# Patient Record
Sex: Male | Born: 1950 | Race: White | Hispanic: No | Marital: Married | Smoking: Former smoker
Health system: Southern US, Community
[De-identification: ages and names within clinical notes are randomized; demographics above are authoritative.]

## PROBLEM LIST (undated history)

## (undated) DIAGNOSIS — G4730 Sleep apnea, unspecified: Secondary | ICD-10-CM

## (undated) DIAGNOSIS — E785 Hyperlipidemia, unspecified: Secondary | ICD-10-CM

## (undated) DIAGNOSIS — M1990 Unspecified osteoarthritis, unspecified site: Secondary | ICD-10-CM

## (undated) DIAGNOSIS — I10 Essential (primary) hypertension: Secondary | ICD-10-CM

## (undated) DIAGNOSIS — C801 Malignant (primary) neoplasm, unspecified: Secondary | ICD-10-CM

## (undated) DIAGNOSIS — I639 Cerebral infarction, unspecified: Secondary | ICD-10-CM

## (undated) DIAGNOSIS — R002 Palpitations: Secondary | ICD-10-CM

## (undated) DIAGNOSIS — Z87442 Personal history of urinary calculi: Secondary | ICD-10-CM

## (undated) HISTORY — PX: LOOP RECORDER REMOVAL: EP1215

## (undated) HISTORY — PX: CATARACT EXTRACTION, BILATERAL: SHX1313

## (undated) HISTORY — PX: LOOP RECORDER INSERTION: EP1214

## (undated) HISTORY — PX: EYE SURGERY: SHX253

## (undated) HISTORY — PX: CARDIAC CATHETERIZATION: SHX172

## (undated) HISTORY — PX: NASAL SINUS SURGERY: SHX719

## (undated) HISTORY — PX: HERNIA REPAIR: SHX51

## (undated) HISTORY — PX: UMBILICAL HERNIA REPAIR: SHX196

## (undated) HISTORY — PX: APPENDECTOMY: SHX54

---

## 2015-01-29 ENCOUNTER — Ambulatory Visit: Payer: Self-pay | Admitting: Allergy and Immunology

## 2016-08-06 DIAGNOSIS — J069 Acute upper respiratory infection, unspecified: Secondary | ICD-10-CM | POA: Diagnosis not present

## 2016-08-06 DIAGNOSIS — J029 Acute pharyngitis, unspecified: Secondary | ICD-10-CM | POA: Diagnosis not present

## 2016-08-06 DIAGNOSIS — R05 Cough: Secondary | ICD-10-CM | POA: Diagnosis not present

## 2016-11-10 DIAGNOSIS — Z23 Encounter for immunization: Secondary | ICD-10-CM | POA: Diagnosis not present

## 2016-11-10 DIAGNOSIS — Z6826 Body mass index (BMI) 26.0-26.9, adult: Secondary | ICD-10-CM | POA: Diagnosis not present

## 2016-11-10 DIAGNOSIS — M25511 Pain in right shoulder: Secondary | ICD-10-CM | POA: Diagnosis not present

## 2016-12-28 DIAGNOSIS — H43811 Vitreous degeneration, right eye: Secondary | ICD-10-CM | POA: Diagnosis not present

## 2017-01-19 DIAGNOSIS — I10 Essential (primary) hypertension: Secondary | ICD-10-CM | POA: Diagnosis not present

## 2017-01-19 DIAGNOSIS — Z6827 Body mass index (BMI) 27.0-27.9, adult: Secondary | ICD-10-CM | POA: Diagnosis not present

## 2017-01-19 DIAGNOSIS — Z79899 Other long term (current) drug therapy: Secondary | ICD-10-CM | POA: Diagnosis not present

## 2017-01-19 DIAGNOSIS — Z125 Encounter for screening for malignant neoplasm of prostate: Secondary | ICD-10-CM | POA: Diagnosis not present

## 2017-01-19 DIAGNOSIS — Z1211 Encounter for screening for malignant neoplasm of colon: Secondary | ICD-10-CM | POA: Diagnosis not present

## 2017-01-19 DIAGNOSIS — Z9181 History of falling: Secondary | ICD-10-CM | POA: Diagnosis not present

## 2017-01-19 DIAGNOSIS — Z1331 Encounter for screening for depression: Secondary | ICD-10-CM | POA: Diagnosis not present

## 2017-01-19 DIAGNOSIS — Z23 Encounter for immunization: Secondary | ICD-10-CM | POA: Diagnosis not present

## 2017-01-19 DIAGNOSIS — Z1339 Encounter for screening examination for other mental health and behavioral disorders: Secondary | ICD-10-CM | POA: Diagnosis not present

## 2017-01-19 DIAGNOSIS — Z8673 Personal history of transient ischemic attack (TIA), and cerebral infarction without residual deficits: Secondary | ICD-10-CM | POA: Diagnosis not present

## 2017-01-19 DIAGNOSIS — E785 Hyperlipidemia, unspecified: Secondary | ICD-10-CM | POA: Diagnosis not present

## 2017-01-31 DIAGNOSIS — Z1212 Encounter for screening for malignant neoplasm of rectum: Secondary | ICD-10-CM | POA: Diagnosis not present

## 2017-01-31 DIAGNOSIS — Z1211 Encounter for screening for malignant neoplasm of colon: Secondary | ICD-10-CM | POA: Diagnosis not present

## 2017-12-08 DIAGNOSIS — M7552 Bursitis of left shoulder: Secondary | ICD-10-CM | POA: Diagnosis not present

## 2017-12-12 DIAGNOSIS — Z23 Encounter for immunization: Secondary | ICD-10-CM | POA: Diagnosis not present

## 2018-01-28 DIAGNOSIS — Z9181 History of falling: Secondary | ICD-10-CM | POA: Diagnosis not present

## 2018-01-28 DIAGNOSIS — Z6826 Body mass index (BMI) 26.0-26.9, adult: Secondary | ICD-10-CM | POA: Diagnosis not present

## 2018-01-28 DIAGNOSIS — M19012 Primary osteoarthritis, left shoulder: Secondary | ICD-10-CM | POA: Diagnosis not present

## 2018-01-28 DIAGNOSIS — Z0000 Encounter for general adult medical examination without abnormal findings: Secondary | ICD-10-CM | POA: Diagnosis not present

## 2018-01-28 DIAGNOSIS — I10 Essential (primary) hypertension: Secondary | ICD-10-CM | POA: Diagnosis not present

## 2018-01-28 DIAGNOSIS — Z1331 Encounter for screening for depression: Secondary | ICD-10-CM | POA: Diagnosis not present

## 2018-01-28 DIAGNOSIS — Z005 Encounter for examination of potential donor of organ and tissue: Secondary | ICD-10-CM | POA: Diagnosis not present

## 2018-01-28 DIAGNOSIS — E785 Hyperlipidemia, unspecified: Secondary | ICD-10-CM | POA: Diagnosis not present

## 2018-01-28 DIAGNOSIS — Z23 Encounter for immunization: Secondary | ICD-10-CM | POA: Diagnosis not present

## 2018-01-28 DIAGNOSIS — Z1339 Encounter for screening examination for other mental health and behavioral disorders: Secondary | ICD-10-CM | POA: Diagnosis not present

## 2018-01-28 DIAGNOSIS — E7800 Pure hypercholesterolemia, unspecified: Secondary | ICD-10-CM | POA: Diagnosis not present

## 2018-01-28 DIAGNOSIS — Z79899 Other long term (current) drug therapy: Secondary | ICD-10-CM | POA: Diagnosis not present

## 2018-02-04 DIAGNOSIS — G4733 Obstructive sleep apnea (adult) (pediatric): Secondary | ICD-10-CM | POA: Diagnosis not present

## 2018-05-06 DIAGNOSIS — G4733 Obstructive sleep apnea (adult) (pediatric): Secondary | ICD-10-CM | POA: Diagnosis not present

## 2018-05-21 DIAGNOSIS — S92354A Nondisplaced fracture of fifth metatarsal bone, right foot, initial encounter for closed fracture: Secondary | ICD-10-CM | POA: Diagnosis not present

## 2018-05-21 DIAGNOSIS — W1830XA Fall on same level, unspecified, initial encounter: Secondary | ICD-10-CM | POA: Diagnosis not present

## 2018-05-21 DIAGNOSIS — S63501A Unspecified sprain of right wrist, initial encounter: Secondary | ICD-10-CM | POA: Diagnosis not present

## 2018-07-16 DIAGNOSIS — I10 Essential (primary) hypertension: Secondary | ICD-10-CM | POA: Diagnosis not present

## 2018-07-16 DIAGNOSIS — E785 Hyperlipidemia, unspecified: Secondary | ICD-10-CM | POA: Diagnosis not present

## 2018-09-12 DIAGNOSIS — M5441 Lumbago with sciatica, right side: Secondary | ICD-10-CM | POA: Diagnosis not present

## 2018-09-12 DIAGNOSIS — M9902 Segmental and somatic dysfunction of thoracic region: Secondary | ICD-10-CM | POA: Diagnosis not present

## 2018-09-12 DIAGNOSIS — M9904 Segmental and somatic dysfunction of sacral region: Secondary | ICD-10-CM | POA: Diagnosis not present

## 2018-09-12 DIAGNOSIS — M62830 Muscle spasm of back: Secondary | ICD-10-CM | POA: Diagnosis not present

## 2018-09-12 DIAGNOSIS — M5136 Other intervertebral disc degeneration, lumbar region: Secondary | ICD-10-CM | POA: Diagnosis not present

## 2018-09-12 DIAGNOSIS — M9903 Segmental and somatic dysfunction of lumbar region: Secondary | ICD-10-CM | POA: Diagnosis not present

## 2018-09-16 DIAGNOSIS — M62830 Muscle spasm of back: Secondary | ICD-10-CM | POA: Diagnosis not present

## 2018-09-16 DIAGNOSIS — M9904 Segmental and somatic dysfunction of sacral region: Secondary | ICD-10-CM | POA: Diagnosis not present

## 2018-09-16 DIAGNOSIS — M5441 Lumbago with sciatica, right side: Secondary | ICD-10-CM | POA: Diagnosis not present

## 2018-09-16 DIAGNOSIS — M9903 Segmental and somatic dysfunction of lumbar region: Secondary | ICD-10-CM | POA: Diagnosis not present

## 2018-09-16 DIAGNOSIS — M9902 Segmental and somatic dysfunction of thoracic region: Secondary | ICD-10-CM | POA: Diagnosis not present

## 2018-09-16 DIAGNOSIS — M5136 Other intervertebral disc degeneration, lumbar region: Secondary | ICD-10-CM | POA: Diagnosis not present

## 2018-09-19 DIAGNOSIS — M5441 Lumbago with sciatica, right side: Secondary | ICD-10-CM | POA: Diagnosis not present

## 2018-09-19 DIAGNOSIS — M9904 Segmental and somatic dysfunction of sacral region: Secondary | ICD-10-CM | POA: Diagnosis not present

## 2018-09-19 DIAGNOSIS — M9902 Segmental and somatic dysfunction of thoracic region: Secondary | ICD-10-CM | POA: Diagnosis not present

## 2018-09-19 DIAGNOSIS — M62830 Muscle spasm of back: Secondary | ICD-10-CM | POA: Diagnosis not present

## 2018-09-19 DIAGNOSIS — M9903 Segmental and somatic dysfunction of lumbar region: Secondary | ICD-10-CM | POA: Diagnosis not present

## 2018-09-19 DIAGNOSIS — M5136 Other intervertebral disc degeneration, lumbar region: Secondary | ICD-10-CM | POA: Diagnosis not present

## 2018-09-20 DIAGNOSIS — M5136 Other intervertebral disc degeneration, lumbar region: Secondary | ICD-10-CM | POA: Diagnosis not present

## 2018-09-20 DIAGNOSIS — M9903 Segmental and somatic dysfunction of lumbar region: Secondary | ICD-10-CM | POA: Diagnosis not present

## 2018-09-20 DIAGNOSIS — M62830 Muscle spasm of back: Secondary | ICD-10-CM | POA: Diagnosis not present

## 2018-09-20 DIAGNOSIS — M9902 Segmental and somatic dysfunction of thoracic region: Secondary | ICD-10-CM | POA: Diagnosis not present

## 2018-09-20 DIAGNOSIS — M9904 Segmental and somatic dysfunction of sacral region: Secondary | ICD-10-CM | POA: Diagnosis not present

## 2018-09-20 DIAGNOSIS — M5441 Lumbago with sciatica, right side: Secondary | ICD-10-CM | POA: Diagnosis not present

## 2018-09-25 DIAGNOSIS — M5441 Lumbago with sciatica, right side: Secondary | ICD-10-CM | POA: Diagnosis not present

## 2018-09-25 DIAGNOSIS — M5136 Other intervertebral disc degeneration, lumbar region: Secondary | ICD-10-CM | POA: Diagnosis not present

## 2018-09-25 DIAGNOSIS — M9904 Segmental and somatic dysfunction of sacral region: Secondary | ICD-10-CM | POA: Diagnosis not present

## 2018-09-25 DIAGNOSIS — M62830 Muscle spasm of back: Secondary | ICD-10-CM | POA: Diagnosis not present

## 2018-09-25 DIAGNOSIS — M9902 Segmental and somatic dysfunction of thoracic region: Secondary | ICD-10-CM | POA: Diagnosis not present

## 2018-09-25 DIAGNOSIS — M9903 Segmental and somatic dysfunction of lumbar region: Secondary | ICD-10-CM | POA: Diagnosis not present

## 2018-09-26 DIAGNOSIS — M9903 Segmental and somatic dysfunction of lumbar region: Secondary | ICD-10-CM | POA: Diagnosis not present

## 2018-09-26 DIAGNOSIS — M5441 Lumbago with sciatica, right side: Secondary | ICD-10-CM | POA: Diagnosis not present

## 2018-09-26 DIAGNOSIS — M5136 Other intervertebral disc degeneration, lumbar region: Secondary | ICD-10-CM | POA: Diagnosis not present

## 2018-09-26 DIAGNOSIS — M62830 Muscle spasm of back: Secondary | ICD-10-CM | POA: Diagnosis not present

## 2018-09-26 DIAGNOSIS — M9902 Segmental and somatic dysfunction of thoracic region: Secondary | ICD-10-CM | POA: Diagnosis not present

## 2018-09-26 DIAGNOSIS — M9904 Segmental and somatic dysfunction of sacral region: Secondary | ICD-10-CM | POA: Diagnosis not present

## 2018-09-27 DIAGNOSIS — M9902 Segmental and somatic dysfunction of thoracic region: Secondary | ICD-10-CM | POA: Diagnosis not present

## 2018-09-27 DIAGNOSIS — M5136 Other intervertebral disc degeneration, lumbar region: Secondary | ICD-10-CM | POA: Diagnosis not present

## 2018-09-27 DIAGNOSIS — M5441 Lumbago with sciatica, right side: Secondary | ICD-10-CM | POA: Diagnosis not present

## 2018-09-27 DIAGNOSIS — M9903 Segmental and somatic dysfunction of lumbar region: Secondary | ICD-10-CM | POA: Diagnosis not present

## 2018-09-27 DIAGNOSIS — M62830 Muscle spasm of back: Secondary | ICD-10-CM | POA: Diagnosis not present

## 2018-09-27 DIAGNOSIS — M9904 Segmental and somatic dysfunction of sacral region: Secondary | ICD-10-CM | POA: Diagnosis not present

## 2018-09-30 DIAGNOSIS — M9904 Segmental and somatic dysfunction of sacral region: Secondary | ICD-10-CM | POA: Diagnosis not present

## 2018-09-30 DIAGNOSIS — M5441 Lumbago with sciatica, right side: Secondary | ICD-10-CM | POA: Diagnosis not present

## 2018-09-30 DIAGNOSIS — M9902 Segmental and somatic dysfunction of thoracic region: Secondary | ICD-10-CM | POA: Diagnosis not present

## 2018-09-30 DIAGNOSIS — M5136 Other intervertebral disc degeneration, lumbar region: Secondary | ICD-10-CM | POA: Diagnosis not present

## 2018-09-30 DIAGNOSIS — M9903 Segmental and somatic dysfunction of lumbar region: Secondary | ICD-10-CM | POA: Diagnosis not present

## 2018-09-30 DIAGNOSIS — M62830 Muscle spasm of back: Secondary | ICD-10-CM | POA: Diagnosis not present

## 2018-10-02 DIAGNOSIS — M62830 Muscle spasm of back: Secondary | ICD-10-CM | POA: Diagnosis not present

## 2018-10-02 DIAGNOSIS — M9903 Segmental and somatic dysfunction of lumbar region: Secondary | ICD-10-CM | POA: Diagnosis not present

## 2018-10-02 DIAGNOSIS — M9904 Segmental and somatic dysfunction of sacral region: Secondary | ICD-10-CM | POA: Diagnosis not present

## 2018-10-02 DIAGNOSIS — M5136 Other intervertebral disc degeneration, lumbar region: Secondary | ICD-10-CM | POA: Diagnosis not present

## 2018-10-02 DIAGNOSIS — M9902 Segmental and somatic dysfunction of thoracic region: Secondary | ICD-10-CM | POA: Diagnosis not present

## 2018-10-02 DIAGNOSIS — M5441 Lumbago with sciatica, right side: Secondary | ICD-10-CM | POA: Diagnosis not present

## 2018-10-04 DIAGNOSIS — M9902 Segmental and somatic dysfunction of thoracic region: Secondary | ICD-10-CM | POA: Diagnosis not present

## 2018-10-04 DIAGNOSIS — M9903 Segmental and somatic dysfunction of lumbar region: Secondary | ICD-10-CM | POA: Diagnosis not present

## 2018-10-04 DIAGNOSIS — M5441 Lumbago with sciatica, right side: Secondary | ICD-10-CM | POA: Diagnosis not present

## 2018-10-04 DIAGNOSIS — M9904 Segmental and somatic dysfunction of sacral region: Secondary | ICD-10-CM | POA: Diagnosis not present

## 2018-10-04 DIAGNOSIS — M62830 Muscle spasm of back: Secondary | ICD-10-CM | POA: Diagnosis not present

## 2018-10-04 DIAGNOSIS — M5136 Other intervertebral disc degeneration, lumbar region: Secondary | ICD-10-CM | POA: Diagnosis not present

## 2018-10-07 DIAGNOSIS — M62830 Muscle spasm of back: Secondary | ICD-10-CM | POA: Diagnosis not present

## 2018-10-07 DIAGNOSIS — M5441 Lumbago with sciatica, right side: Secondary | ICD-10-CM | POA: Diagnosis not present

## 2018-10-07 DIAGNOSIS — M5136 Other intervertebral disc degeneration, lumbar region: Secondary | ICD-10-CM | POA: Diagnosis not present

## 2018-10-07 DIAGNOSIS — M9902 Segmental and somatic dysfunction of thoracic region: Secondary | ICD-10-CM | POA: Diagnosis not present

## 2018-10-07 DIAGNOSIS — M9903 Segmental and somatic dysfunction of lumbar region: Secondary | ICD-10-CM | POA: Diagnosis not present

## 2018-10-07 DIAGNOSIS — M9904 Segmental and somatic dysfunction of sacral region: Secondary | ICD-10-CM | POA: Diagnosis not present

## 2018-10-09 DIAGNOSIS — M62830 Muscle spasm of back: Secondary | ICD-10-CM | POA: Diagnosis not present

## 2018-10-09 DIAGNOSIS — M5136 Other intervertebral disc degeneration, lumbar region: Secondary | ICD-10-CM | POA: Diagnosis not present

## 2018-10-09 DIAGNOSIS — M5441 Lumbago with sciatica, right side: Secondary | ICD-10-CM | POA: Diagnosis not present

## 2018-10-09 DIAGNOSIS — M9904 Segmental and somatic dysfunction of sacral region: Secondary | ICD-10-CM | POA: Diagnosis not present

## 2018-10-09 DIAGNOSIS — M9903 Segmental and somatic dysfunction of lumbar region: Secondary | ICD-10-CM | POA: Diagnosis not present

## 2018-10-09 DIAGNOSIS — M9902 Segmental and somatic dysfunction of thoracic region: Secondary | ICD-10-CM | POA: Diagnosis not present

## 2018-10-11 DIAGNOSIS — M62830 Muscle spasm of back: Secondary | ICD-10-CM | POA: Diagnosis not present

## 2018-10-11 DIAGNOSIS — M5441 Lumbago with sciatica, right side: Secondary | ICD-10-CM | POA: Diagnosis not present

## 2018-10-11 DIAGNOSIS — M9903 Segmental and somatic dysfunction of lumbar region: Secondary | ICD-10-CM | POA: Diagnosis not present

## 2018-10-11 DIAGNOSIS — M5136 Other intervertebral disc degeneration, lumbar region: Secondary | ICD-10-CM | POA: Diagnosis not present

## 2018-10-11 DIAGNOSIS — M9902 Segmental and somatic dysfunction of thoracic region: Secondary | ICD-10-CM | POA: Diagnosis not present

## 2018-10-11 DIAGNOSIS — M9904 Segmental and somatic dysfunction of sacral region: Secondary | ICD-10-CM | POA: Diagnosis not present

## 2018-10-18 DIAGNOSIS — M5441 Lumbago with sciatica, right side: Secondary | ICD-10-CM | POA: Diagnosis not present

## 2018-10-18 DIAGNOSIS — M62830 Muscle spasm of back: Secondary | ICD-10-CM | POA: Diagnosis not present

## 2018-10-18 DIAGNOSIS — M5136 Other intervertebral disc degeneration, lumbar region: Secondary | ICD-10-CM | POA: Diagnosis not present

## 2018-10-18 DIAGNOSIS — J309 Allergic rhinitis, unspecified: Secondary | ICD-10-CM | POA: Diagnosis not present

## 2018-10-18 DIAGNOSIS — M9902 Segmental and somatic dysfunction of thoracic region: Secondary | ICD-10-CM | POA: Diagnosis not present

## 2018-10-18 DIAGNOSIS — M9904 Segmental and somatic dysfunction of sacral region: Secondary | ICD-10-CM | POA: Diagnosis not present

## 2018-10-18 DIAGNOSIS — Z6826 Body mass index (BMI) 26.0-26.9, adult: Secondary | ICD-10-CM | POA: Diagnosis not present

## 2018-10-18 DIAGNOSIS — M9903 Segmental and somatic dysfunction of lumbar region: Secondary | ICD-10-CM | POA: Diagnosis not present

## 2018-10-30 DIAGNOSIS — M5136 Other intervertebral disc degeneration, lumbar region: Secondary | ICD-10-CM | POA: Diagnosis not present

## 2018-10-30 DIAGNOSIS — M9902 Segmental and somatic dysfunction of thoracic region: Secondary | ICD-10-CM | POA: Diagnosis not present

## 2018-10-30 DIAGNOSIS — M62830 Muscle spasm of back: Secondary | ICD-10-CM | POA: Diagnosis not present

## 2018-10-30 DIAGNOSIS — M9903 Segmental and somatic dysfunction of lumbar region: Secondary | ICD-10-CM | POA: Diagnosis not present

## 2018-10-30 DIAGNOSIS — M5441 Lumbago with sciatica, right side: Secondary | ICD-10-CM | POA: Diagnosis not present

## 2018-10-30 DIAGNOSIS — M9904 Segmental and somatic dysfunction of sacral region: Secondary | ICD-10-CM | POA: Diagnosis not present

## 2018-11-07 DIAGNOSIS — M5441 Lumbago with sciatica, right side: Secondary | ICD-10-CM | POA: Diagnosis not present

## 2018-11-07 DIAGNOSIS — M62830 Muscle spasm of back: Secondary | ICD-10-CM | POA: Diagnosis not present

## 2018-11-07 DIAGNOSIS — M9903 Segmental and somatic dysfunction of lumbar region: Secondary | ICD-10-CM | POA: Diagnosis not present

## 2018-11-07 DIAGNOSIS — G4733 Obstructive sleep apnea (adult) (pediatric): Secondary | ICD-10-CM | POA: Diagnosis not present

## 2018-11-07 DIAGNOSIS — M9904 Segmental and somatic dysfunction of sacral region: Secondary | ICD-10-CM | POA: Diagnosis not present

## 2018-11-07 DIAGNOSIS — M5136 Other intervertebral disc degeneration, lumbar region: Secondary | ICD-10-CM | POA: Diagnosis not present

## 2018-11-07 DIAGNOSIS — M9902 Segmental and somatic dysfunction of thoracic region: Secondary | ICD-10-CM | POA: Diagnosis not present

## 2018-11-21 DIAGNOSIS — M62830 Muscle spasm of back: Secondary | ICD-10-CM | POA: Diagnosis not present

## 2018-11-21 DIAGNOSIS — M9904 Segmental and somatic dysfunction of sacral region: Secondary | ICD-10-CM | POA: Diagnosis not present

## 2018-11-21 DIAGNOSIS — R05 Cough: Secondary | ICD-10-CM | POA: Diagnosis not present

## 2018-11-21 DIAGNOSIS — M9902 Segmental and somatic dysfunction of thoracic region: Secondary | ICD-10-CM | POA: Diagnosis not present

## 2018-11-21 DIAGNOSIS — M5441 Lumbago with sciatica, right side: Secondary | ICD-10-CM | POA: Diagnosis not present

## 2018-11-21 DIAGNOSIS — Z6825 Body mass index (BMI) 25.0-25.9, adult: Secondary | ICD-10-CM | POA: Diagnosis not present

## 2018-11-21 DIAGNOSIS — M5136 Other intervertebral disc degeneration, lumbar region: Secondary | ICD-10-CM | POA: Diagnosis not present

## 2018-11-21 DIAGNOSIS — R252 Cramp and spasm: Secondary | ICD-10-CM | POA: Diagnosis not present

## 2018-11-21 DIAGNOSIS — M9903 Segmental and somatic dysfunction of lumbar region: Secondary | ICD-10-CM | POA: Diagnosis not present

## 2018-11-21 DIAGNOSIS — Z23 Encounter for immunization: Secondary | ICD-10-CM | POA: Diagnosis not present

## 2018-12-10 DIAGNOSIS — M79662 Pain in left lower leg: Secondary | ICD-10-CM | POA: Diagnosis not present

## 2018-12-10 DIAGNOSIS — R252 Cramp and spasm: Secondary | ICD-10-CM | POA: Diagnosis not present

## 2018-12-10 DIAGNOSIS — M79661 Pain in right lower leg: Secondary | ICD-10-CM | POA: Diagnosis not present

## 2019-03-17 DIAGNOSIS — Z1331 Encounter for screening for depression: Secondary | ICD-10-CM | POA: Diagnosis not present

## 2019-03-17 DIAGNOSIS — Z8673 Personal history of transient ischemic attack (TIA), and cerebral infarction without residual deficits: Secondary | ICD-10-CM | POA: Diagnosis not present

## 2019-03-17 DIAGNOSIS — Z79899 Other long term (current) drug therapy: Secondary | ICD-10-CM | POA: Diagnosis not present

## 2019-03-17 DIAGNOSIS — Z6827 Body mass index (BMI) 27.0-27.9, adult: Secondary | ICD-10-CM | POA: Diagnosis not present

## 2019-03-17 DIAGNOSIS — Z0000 Encounter for general adult medical examination without abnormal findings: Secondary | ICD-10-CM | POA: Diagnosis not present

## 2019-03-17 DIAGNOSIS — I10 Essential (primary) hypertension: Secondary | ICD-10-CM | POA: Diagnosis not present

## 2019-03-17 DIAGNOSIS — Z125 Encounter for screening for malignant neoplasm of prostate: Secondary | ICD-10-CM | POA: Diagnosis not present

## 2019-03-17 DIAGNOSIS — E7800 Pure hypercholesterolemia, unspecified: Secondary | ICD-10-CM | POA: Diagnosis not present

## 2019-06-03 DIAGNOSIS — M89371 Hypertrophy of bone, right ankle and foot: Secondary | ICD-10-CM

## 2019-06-03 HISTORY — DX: Hypertrophy of bone, right ankle and foot: M89.371

## 2019-07-31 DIAGNOSIS — D2239 Melanocytic nevi of other parts of face: Secondary | ICD-10-CM | POA: Diagnosis not present

## 2019-07-31 DIAGNOSIS — L821 Other seborrheic keratosis: Secondary | ICD-10-CM | POA: Diagnosis not present

## 2019-07-31 DIAGNOSIS — L578 Other skin changes due to chronic exposure to nonionizing radiation: Secondary | ICD-10-CM | POA: Diagnosis not present

## 2019-07-31 DIAGNOSIS — L814 Other melanin hyperpigmentation: Secondary | ICD-10-CM | POA: Diagnosis not present

## 2019-07-31 DIAGNOSIS — D485 Neoplasm of uncertain behavior of skin: Secondary | ICD-10-CM | POA: Diagnosis not present

## 2019-08-27 DIAGNOSIS — C44622 Squamous cell carcinoma of skin of right upper limb, including shoulder: Secondary | ICD-10-CM | POA: Diagnosis not present

## 2019-09-10 DIAGNOSIS — C44612 Basal cell carcinoma of skin of right upper limb, including shoulder: Secondary | ICD-10-CM | POA: Diagnosis not present

## 2019-10-02 DIAGNOSIS — M89371 Hypertrophy of bone, right ankle and foot: Secondary | ICD-10-CM | POA: Diagnosis not present

## 2019-10-06 DIAGNOSIS — I10 Essential (primary) hypertension: Secondary | ICD-10-CM | POA: Diagnosis not present

## 2019-10-06 DIAGNOSIS — I499 Cardiac arrhythmia, unspecified: Secondary | ICD-10-CM | POA: Diagnosis not present

## 2019-10-06 DIAGNOSIS — L03211 Cellulitis of face: Secondary | ICD-10-CM | POA: Diagnosis not present

## 2019-10-06 DIAGNOSIS — R609 Edema, unspecified: Secondary | ICD-10-CM | POA: Diagnosis not present

## 2019-11-05 DIAGNOSIS — Z23 Encounter for immunization: Secondary | ICD-10-CM | POA: Diagnosis not present

## 2019-11-10 DIAGNOSIS — M533 Sacrococcygeal disorders, not elsewhere classified: Secondary | ICD-10-CM | POA: Diagnosis not present

## 2019-11-10 DIAGNOSIS — R599 Enlarged lymph nodes, unspecified: Secondary | ICD-10-CM | POA: Diagnosis not present

## 2019-11-17 DIAGNOSIS — M4316 Spondylolisthesis, lumbar region: Secondary | ICD-10-CM | POA: Diagnosis not present

## 2019-11-17 DIAGNOSIS — I10 Essential (primary) hypertension: Secondary | ICD-10-CM | POA: Diagnosis not present

## 2019-11-17 DIAGNOSIS — Z6825 Body mass index (BMI) 25.0-25.9, adult: Secondary | ICD-10-CM | POA: Diagnosis not present

## 2019-11-26 DIAGNOSIS — M4316 Spondylolisthesis, lumbar region: Secondary | ICD-10-CM | POA: Diagnosis not present

## 2019-11-26 DIAGNOSIS — M5459 Other low back pain: Secondary | ICD-10-CM | POA: Diagnosis not present

## 2019-12-02 DIAGNOSIS — M545 Low back pain, unspecified: Secondary | ICD-10-CM | POA: Diagnosis not present

## 2019-12-02 DIAGNOSIS — M4316 Spondylolisthesis, lumbar region: Secondary | ICD-10-CM | POA: Diagnosis not present

## 2019-12-03 DIAGNOSIS — M4316 Spondylolisthesis, lumbar region: Secondary | ICD-10-CM | POA: Diagnosis not present

## 2019-12-03 DIAGNOSIS — M5459 Other low back pain: Secondary | ICD-10-CM | POA: Diagnosis not present

## 2019-12-05 DIAGNOSIS — M4316 Spondylolisthesis, lumbar region: Secondary | ICD-10-CM | POA: Diagnosis not present

## 2019-12-05 DIAGNOSIS — M5459 Other low back pain: Secondary | ICD-10-CM | POA: Diagnosis not present

## 2019-12-09 DIAGNOSIS — M5459 Other low back pain: Secondary | ICD-10-CM | POA: Diagnosis not present

## 2019-12-09 DIAGNOSIS — M4316 Spondylolisthesis, lumbar region: Secondary | ICD-10-CM | POA: Diagnosis not present

## 2019-12-15 DIAGNOSIS — M4316 Spondylolisthesis, lumbar region: Secondary | ICD-10-CM | POA: Diagnosis not present

## 2019-12-15 DIAGNOSIS — M5459 Other low back pain: Secondary | ICD-10-CM | POA: Diagnosis not present

## 2019-12-22 DIAGNOSIS — M4316 Spondylolisthesis, lumbar region: Secondary | ICD-10-CM | POA: Diagnosis not present

## 2019-12-22 DIAGNOSIS — Z6826 Body mass index (BMI) 26.0-26.9, adult: Secondary | ICD-10-CM | POA: Diagnosis not present

## 2019-12-22 DIAGNOSIS — I10 Essential (primary) hypertension: Secondary | ICD-10-CM | POA: Diagnosis not present

## 2019-12-23 ENCOUNTER — Other Ambulatory Visit: Payer: Self-pay | Admitting: Neurosurgery

## 2019-12-25 DIAGNOSIS — R220 Localized swelling, mass and lump, head: Secondary | ICD-10-CM | POA: Diagnosis not present

## 2019-12-25 DIAGNOSIS — G4733 Obstructive sleep apnea (adult) (pediatric): Secondary | ICD-10-CM | POA: Diagnosis not present

## 2019-12-25 DIAGNOSIS — Z9989 Dependence on other enabling machines and devices: Secondary | ICD-10-CM | POA: Diagnosis not present

## 2019-12-25 DIAGNOSIS — J342 Deviated nasal septum: Secondary | ICD-10-CM | POA: Diagnosis not present

## 2019-12-29 ENCOUNTER — Encounter (HOSPITAL_COMMUNITY): Payer: Self-pay

## 2019-12-29 ENCOUNTER — Other Ambulatory Visit (HOSPITAL_COMMUNITY)
Admission: RE | Admit: 2019-12-29 | Discharge: 2019-12-29 | Disposition: A | Discharge status: Home / Self Care | Payer: PPO | Source: Ambulatory Visit | Attending: Neurosurgery | Admitting: Neurosurgery

## 2019-12-29 ENCOUNTER — Encounter (HOSPITAL_COMMUNITY)
Admission: RE | Admit: 2019-12-29 | Discharge: 2019-12-29 | Disposition: A | Discharge status: Home / Self Care | Payer: PPO | Source: Ambulatory Visit | Attending: Neurosurgery | Admitting: Neurosurgery

## 2019-12-29 ENCOUNTER — Other Ambulatory Visit: Payer: Self-pay

## 2019-12-29 DIAGNOSIS — G4730 Sleep apnea, unspecified: Secondary | ICD-10-CM | POA: Diagnosis present

## 2019-12-29 DIAGNOSIS — Z7902 Long term (current) use of antithrombotics/antiplatelets: Secondary | ICD-10-CM | POA: Diagnosis not present

## 2019-12-29 DIAGNOSIS — Z87891 Personal history of nicotine dependence: Secondary | ICD-10-CM | POA: Diagnosis not present

## 2019-12-29 DIAGNOSIS — M4316 Spondylolisthesis, lumbar region: Secondary | ICD-10-CM | POA: Diagnosis present

## 2019-12-29 DIAGNOSIS — I10 Essential (primary) hypertension: Secondary | ICD-10-CM | POA: Diagnosis present

## 2019-12-29 DIAGNOSIS — M48061 Spinal stenosis, lumbar region without neurogenic claudication: Secondary | ICD-10-CM | POA: Diagnosis present

## 2019-12-29 DIAGNOSIS — Z4889 Encounter for other specified surgical aftercare: Secondary | ICD-10-CM | POA: Diagnosis not present

## 2019-12-29 DIAGNOSIS — Z01818 Encounter for other preprocedural examination: Secondary | ICD-10-CM | POA: Insufficient documentation

## 2019-12-29 DIAGNOSIS — Z20822 Contact with and (suspected) exposure to covid-19: Secondary | ICD-10-CM | POA: Diagnosis present

## 2019-12-29 DIAGNOSIS — I639 Cerebral infarction, unspecified: Secondary | ICD-10-CM | POA: Diagnosis not present

## 2019-12-29 DIAGNOSIS — Z79899 Other long term (current) drug therapy: Secondary | ICD-10-CM | POA: Diagnosis not present

## 2019-12-29 DIAGNOSIS — M4326 Fusion of spine, lumbar region: Secondary | ICD-10-CM | POA: Diagnosis not present

## 2019-12-29 DIAGNOSIS — M5416 Radiculopathy, lumbar region: Secondary | ICD-10-CM | POA: Diagnosis present

## 2019-12-29 DIAGNOSIS — E785 Hyperlipidemia, unspecified: Secondary | ICD-10-CM | POA: Diagnosis present

## 2019-12-29 DIAGNOSIS — Z87442 Personal history of urinary calculi: Secondary | ICD-10-CM | POA: Diagnosis not present

## 2019-12-29 DIAGNOSIS — Z888 Allergy status to other drugs, medicaments and biological substances status: Secondary | ICD-10-CM | POA: Diagnosis not present

## 2019-12-29 DIAGNOSIS — Z885 Allergy status to narcotic agent status: Secondary | ICD-10-CM | POA: Diagnosis not present

## 2019-12-29 DIAGNOSIS — Z01812 Encounter for preprocedural laboratory examination: Secondary | ICD-10-CM | POA: Insufficient documentation

## 2019-12-29 DIAGNOSIS — M4306 Spondylolysis, lumbar region: Secondary | ICD-10-CM | POA: Diagnosis present

## 2019-12-29 DIAGNOSIS — M1990 Unspecified osteoarthritis, unspecified site: Secondary | ICD-10-CM | POA: Diagnosis present

## 2019-12-29 DIAGNOSIS — Z8673 Personal history of transient ischemic attack (TIA), and cerebral infarction without residual deficits: Secondary | ICD-10-CM | POA: Diagnosis not present

## 2019-12-29 HISTORY — DX: Malignant (primary) neoplasm, unspecified: C80.1

## 2019-12-29 HISTORY — DX: Palpitations: R00.2

## 2019-12-29 HISTORY — DX: Unspecified osteoarthritis, unspecified site: M19.90

## 2019-12-29 HISTORY — DX: Sleep apnea, unspecified: G47.30

## 2019-12-29 HISTORY — DX: Personal history of urinary calculi: Z87.442

## 2019-12-29 HISTORY — DX: Cerebral infarction, unspecified: I63.9

## 2019-12-29 HISTORY — DX: Hyperlipidemia, unspecified: E78.5

## 2019-12-29 HISTORY — DX: Essential (primary) hypertension: I10

## 2019-12-29 LAB — BASIC METABOLIC PANEL
Anion gap: 11 (ref 5–15)
BUN: 9 mg/dL (ref 8–23)
CO2: 22 mmol/L (ref 22–32)
Calcium: 10 mg/dL (ref 8.9–10.3)
Chloride: 105 mmol/L (ref 98–111)
Creatinine, Ser: 0.82 mg/dL (ref 0.61–1.24)
GFR, Estimated: >60 mL/min (ref >60)
Glucose, Bld: 94 mg/dL (ref 70–99)
Potassium: 4.3 mmol/L (ref 3.5–5.1)
Sodium: 138 mmol/L (ref 135–145)

## 2019-12-29 LAB — CBC
HCT: 43.7 % (ref 39.0–52.0)
Hemoglobin: 15.2 g/dL (ref 13.0–17.0)
MCH: 31.3 pg (ref 26.0–34.0)
MCHC: 34.8 g/dL (ref 30.0–36.0)
MCV: 90.1 fL (ref 80.0–100.0)
Platelets: 152 10*3/uL (ref 150–400)
RBC: 4.85 MIL/uL (ref 4.22–5.81)
RDW: 12.8 % (ref 11.5–15.5)
WBC: 5.7 10*3/uL (ref 4.0–10.5)
nRBC: 0 % (ref 0.0–0.2)

## 2019-12-29 LAB — TYPE AND SCREEN
ABO/RH(D): O POS
Antibody Screen: NEGATIVE

## 2019-12-29 LAB — SURGICAL PCR SCREEN
MRSA, PCR: NEGATIVE
Staphylococcus aureus: NEGATIVE

## 2019-12-29 NOTE — Pre-Procedure Instructions (Signed)
Benjamin Griffin  12/29/2019     Your procedure is scheduled on Thursday, December 16  Report to [facility], Main Entrance or Entrance "A" at 8:00 A.M.                Your surgery or procedure is scheduled to begin at 10:00 AM   Call this number if you have problems the morning of surgery: [phone]  This is the number for the Pre- Surgical Desk.                For any other questions, please call [phone], Monday - Friday 8 AM - 4 PM.   Remember:  Do not eat or drink after midnight, Wednesday, December 15.  Take these medicines the morning of surgery with A SIP OF WATER : Choline Fenofibrate (FENOFIBRIC ACID) metoprolol succinate (TOPROL-XL simvastatin (ZOCOR)   Follow your surgeon's instructions regarding Plavix.  May take if needed: acetaminophen (TYLENOL)  fexofenadine (ALLEGRA) SYSTANE OP) eye drops  sodium chloride (OCEAN) nasal spray traMADol (ULTRAM)   1 Week prior to surgery STOP taking Aspirin, Aspirin Products (Goody Powder, Excedrin Migraine), Ibuprofen (Advil), Naproxen (Aleve), Vitamins and Herbal Products (ie Fish Oil).    Special instructions:    [name]- Preparing For Surgery  Before surgery, you can play an important role. Because skin is not sterile, your skin needs to be as free of germs as possible. You can reduce the number of germs on your skin by washing with CHG (chlorahexidine gluconate) Soap before surgery.  CHG is an antiseptic cleaner which kills germs and bonds with the skin to continue killing germs even after washing.    Oral Hygiene is also important to reduce your risk of infection.  Remember - BRUSH YOUR TEETH THE MORNING OF SURGERY WITH YOUR REGULAR TOOTHPASTE  Please do not use if you have an allergy to CHG or antibacterial soaps. If your skin becomes reddened/irritated stop using the CHG.  Do not shave (including legs and underarms) for at least 48 hours prior to first CHG shower. It is OK to shave your  face.  Please follow these instructions carefully.   1. Shower the NIGHT BEFORE SURGERY and the MORNING OF SURGERY with CHG.   2. If you chose to wash your hair, wash your hair first as usual with your normal shampoo.  3. After you shampoo, wash your face and private area with the soap you use at home, then rinse your hair and body thoroughly to remove the shampoo and soap.  4. Use CHG as you would any other liquid soap. You can apply CHG directly to the skin and wash gently with a scrungie or a clean washcloth.   5. Apply the CHG Soap to your body ONLY FROM THE NECK DOWN.  Do not use on open wounds or open sores. Avoid contact with your eyes, ears, mouth and genitals (private parts).   6. Wash thoroughly, paying special attention to the area where your surgery will be performed.  7. Thoroughly rinse your body with warm water from the neck down.  8. DO NOT shower/wash with your normal soap after using and rinsing off the CHG Soap.  9. Pat yourself dry with a CLEAN TOWEL.  10. Wear CLEAN PAJAMAS to bed the night before surgery, wear comfortable clothes the morning of surgery  11. Place CLEAN SHEETS on your bed the night of your first shower and DO NOT SLEEP WITH PETS.  Day of Surgery: Shower as  instructed above. Do not apply any deodorants/lotions, powders or colognes.  Please wear clean clothes to the hospital/surgery center.   Remember to brush your teeth WITH YOUR REGULAR TOOTHPASTE.  Do not wear jewelry, make-up or nail polish.  Do not shave 48 hours prior to surgery.  Men may shave face and neck.  Do not bring valuables to the hospital.  [facility] is not responsible for any belongings or valuables.  Contacts, dentures or bridgework may not be worn into surgery.  Leave your suitcase in the car.  After surgery it may be brought to your room.  For patients admitted to the hospital, discharge time will be determined by your treatment team.  Patients discharged the day of  surgery will not be allowed to drive home.   Please read over the fact sheets that you were given.

## 2019-12-29 NOTE — Progress Notes (Signed)
PCP: Dr. Kennith Maes, [facility] in [place] Cardiologist: denies  EKG: Today CXR: denies ECHO: denies Stress Test: denies Cardiac Cath: approx 20 years ago at [facility] Wears CPAP nightly, sleep study in [place] "many years ago"  BP elevated upon arrival to PAT. Pt reports he's in severe pain and preferred to stand during appt, did take home meds prior to arrival.  Rechecked BP from standing position, still elevated at 188/100. Toni Arthurs with anesthesia notified.  Instructed to monitor BP at home, call PCP to make aware of elevated BP's.  Records requested via fax from PCP for last office note and recent EKG.  Going for Covid testing today.  Patient denies shortness of breath, fever, cough, and chest pain at PAT appointment.  Patient verbalized understanding of instructions provided today at the PAT appointment.  Patient asked to review instructions at home and day of surgery.

## 2019-12-29 NOTE — Pre-Procedure Instructions (Signed)
Benjamin Griffin  12/29/2019     Your procedure is scheduled on Thursday, December 16  Report to [facility], Main Entrance or Entrance "A" at 8:00 A.M.                Your surgery or procedure is scheduled to begin at 10:00 AM   Call this number if you have problems the morning of surgery: [phone]  This is the number for the Pre- Surgical Desk.                For any other questions, please call [phone], Monday - Friday 8 AM - 4 PM.   Remember:  Do not eat or drink after midnight, Wednesday, December 15.  Take these medicines the morning of surgery with A SIP OF WATER : Choline Fenofibrate (FENOFIBRIC ACID) metoprolol succinate (TOPROL-XL simvastatin (ZOCOR)   Follow your surgeon's instructions regarding Plavix. PER YOUR DOCTOR, STOP on DEC 13.  May take if needed: acetaminophen (TYLENOL)  fexofenadine (ALLEGRA) SYSTANE OP) eye drops  sodium chloride (OCEAN) nasal spray traMADol (ULTRAM)   1 Week prior to surgery STOP taking Aspirin, Aspirin Products (Goody Powder, Excedrin Migraine), Ibuprofen (Advil), Naproxen (Aleve), Vitamins and Herbal Products (ie Fish Oil).    Special instructions:    [facility]- Preparing For Surgery  Before surgery, you can play an important role. Because skin is not sterile, your skin needs to be as free of germs as possible. You can reduce the number of germs on your skin by washing with CHG (chlorahexidine gluconate) Soap before surgery.  CHG is an antiseptic cleaner which kills germs and bonds with the skin to continue killing germs even after washing.    Oral Hygiene is also important to reduce your risk of infection.  Remember - BRUSH YOUR TEETH THE MORNING OF SURGERY WITH YOUR REGULAR TOOTHPASTE  Please do not use if you have an allergy to CHG or antibacterial soaps. If your skin becomes reddened/irritated stop using the CHG.  Do not shave (including legs and underarms) for at least 48 hours prior to first CHG shower. It  is OK to shave your face.  Please follow these instructions carefully.   1. Shower the NIGHT BEFORE SURGERY and the MORNING OF SURGERY with CHG.   2. If you chose to wash your hair, wash your hair first as usual with your normal shampoo.  3. After you shampoo, wash your face and private area with the soap you use at home, then rinse your hair and body thoroughly to remove the shampoo and soap.  4. Use CHG as you would any other liquid soap. You can apply CHG directly to the skin and wash gently with a scrungie or a clean washcloth.   5. Apply the CHG Soap to your body ONLY FROM THE NECK DOWN.  Do not use on open wounds or open sores. Avoid contact with your eyes, ears, mouth and genitals (private parts).   6. Wash thoroughly, paying special attention to the area where your surgery will be performed.  7. Thoroughly rinse your body with warm water from the neck down.  8. DO NOT shower/wash with your normal soap after using and rinsing off the CHG Soap.  9. Pat yourself dry with a CLEAN TOWEL.  10. Wear CLEAN PAJAMAS to bed the night before surgery, wear comfortable clothes the morning of surgery  11. Place CLEAN SHEETS on your bed the night of your first shower and DO NOT SLEEP WITH  PETS.  Day of Surgery: Shower as instructed above. Do not apply any deodorants/lotions, powders or colognes.  Please wear clean clothes to the hospital/surgery center.   Remember to brush your teeth WITH YOUR REGULAR TOOTHPASTE.  Do not wear jewelry, make-up or nail polish.  Do not shave 48 hours prior to surgery.  Men may shave face and neck.  Do not bring valuables to the hospital.  [facility] is not responsible for any belongings or valuables.  Contacts, dentures or bridgework may not be worn into surgery.  Leave your suitcase in the car.  After surgery it may be brought to your room.  For patients admitted to the hospital, discharge time will be determined by your treatment team.  Patients  discharged the day of surgery will not be allowed to drive home.   Please read over the fact sheets that you were given.

## 2019-12-30 LAB — SARS CORONAVIRUS 2 (TAT 6-24 HRS): SARS Coronavirus 2: NEGATIVE

## 2019-12-30 NOTE — Progress Notes (Addendum)
Anesthesia Chart Review:  Case: [id number] Date/Time: 01/01/20 0945   Procedure: MINIMALLY INVASIVE (MIS) TRANSFORAMINAL LUMBAR INTERBODY FUSION (TLIF) L4-L5 (Doctor available at 1000) (N/A ) - 3C   Anesthesia type: General   Pre-op diagnosis: SPONDYLOLISTHESIS, LUMBAR REGION   Location: [place] OR ROOM 18 / [facility]   Surgeons: Vallarie Mare, MD      DISCUSSION: Patient is a 69 year old male scheduled for the above procedure.    History includes former smoker (quit 12/17/98), HTN, palpitations, CVA (~ 2000 by imaging), OSA (CPAP), skin cancer. He previously had a loop recorder placed to evaluate for arrhythmias given CVA history, now removed.    BP elevated at PAT 181/85 and 188/100. He was standing for his PAT visit given his back pain. He reported taking Toprol XL 50 mg that morning. He has been on two medication in the past, but reportedly PCP had stopped the second medication. He does monitor BP at home on occasion, with high SBP running up to ~ 170-180 with DBP < 100. He denied chest pain, SOB. His back/leg pain limits some activity/positions, but has recently been able to do yard work (mow, blow leaves) and clean gutters for his and his neighbor's houses without CV symptoms. He said Dr. Helene Kelp within the past few months. Records received from 11/10/19 visit with referral to specialist then for sacral pain with "L5 problem" on imaging. BP was 164/82 at that visit. Patient advised to monitor BP at home and contact PCP if BP readings remain elevated.  He is on Plavix due to CVA history. Last dose 12/28/19.   12/29/19 presurgical COVID-19 test negative. Anesthesia team to evaluate on the day of surgery.   ADDENDUM 12/31/19 10:28 AM:  Attempted to follow-up with Mr. Ulin regarding home BP readings, but only able to leave a voice message. I have updated Nikki at Dr. Marcello Moores' office. He is to take his usual Toprol on the day of surgery and will get VS on arrival.    VS: BP (!) 188/100 Comment:  notified Ebony Hail, PA-C  Pulse 72   Temp 36.9 C (Oral)   Resp 17   Ht 6' (1.829 m)   Wt 89.2 kg   SpO2 98%   BMI 26.66 kg/m    PROVIDERS: Ronita Hipps, MD is PCP    LABS: Labs reviewed: Acceptable for surgery. (all labs ordered are listed, but only abnormal results are displayed)  Labs Reviewed  SURGICAL PCR SCREEN  BASIC METABOLIC PANEL  CBC  TYPE AND SCREEN     EKG: 12/29/19: NSR   CV: Denied prior stress test and echo. He may have had a cardiac cath ~ 2000 when he underwent loop recorder implantation following CVA.   Past Medical History:  Diagnosis Date  . Arthritis   . Cancer ([place])    skin cancer, removed from bilateral arms  . History of kidney stones   . Hyperlipidemia   . Hypertension   . Palpitations   . Sleep apnea    CPAP  . Stroke ([place])    approx 2000, on plavix. When found on CT, was told it was an "old stroke"    Past Surgical History:  Procedure Laterality Date  . APPENDECTOMY    . CARDIAC CATHETERIZATION     approx year 2000 @ [facility] after stroke  . CATARACT EXTRACTION, BILATERAL    . EYE SURGERY    . HERNIA REPAIR    . LOOP RECORDER INSERTION     approx  2000   . LOOP RECORDER REMOVAL     1 week after instertion, approx 2000  . NASAL SINUS SURGERY    . UMBILICAL HERNIA REPAIR      MEDICATIONS: . clopidogrel (PLAVIX) 75 MG tablet  . acetaminophen (TYLENOL) 650 MG CR tablet  . calcium carbonate (TUMS - DOSED IN MG ELEMENTAL CALCIUM) 500 MG chewable tablet  . Choline Fenofibrate (FENOFIBRIC ACID) 135 MG CPDR  . fexofenadine (ALLEGRA) 180 MG tablet  . ibuprofen (ADVIL) 200 MG tablet  . Menthol, Topical Analgesic, (BIOFREEZE ROLL-ON EX)  . metoprolol succinate (TOPROL-XL) 50 MG 24 hr tablet  . Polyethyl Glycol-Propyl Glycol (SYSTANE OP)  . simvastatin (ZOCOR) 20 MG tablet  . sodium chloride (OCEAN) 0.65 % SOLN nasal spray  . traMADol (ULTRAM) 50 MG tablet   No current facility-administered medications for this encounter.     Myra Gianotti, PA-C Surgical Short Stay/Anesthesiology [facility] Phone [phone] [facility] Phone [phone] 12/30/2019 7:34 PM

## 2019-12-30 NOTE — Anesthesia Preprocedure Evaluation (Addendum)
Anesthesia Evaluation  Patient identified by MRN, date of birth, ID band Patient awake    Reviewed: Allergy & Precautions, NPO status , Patient's Chart, lab work & pertinent test results  Airway Mallampati: II  TM Distance: >3 FB     Dental   Pulmonary sleep apnea , former smoker,    breath sounds clear to auscultation       Cardiovascular hypertension,  Rhythm:Regular Rate:Normal     Neuro/Psych CVA    GI/Hepatic negative GI ROS, Neg liver ROS,   Endo/Other  negative endocrine ROS  Renal/GU negative Renal ROS     Musculoskeletal  (+) Arthritis ,   Abdominal   Peds  Hematology   Anesthesia Other Findings   Reproductive/Obstetrics                            Anesthesia Physical Anesthesia Plan  ASA: III  Anesthesia Plan: General   Post-op Pain Management:    Induction:   PONV Risk Score and Plan: 2 and Dexamethasone, Ondansetron and Midazolam  Airway Management Planned: Oral ETT  Additional Equipment:   Intra-op Plan:   Post-operative Plan: Possible Post-op intubation/ventilation  Informed Consent: I have reviewed the patients History and Physical, chart, labs and discussed the procedure including the risks, benefits and alternatives for the proposed anesthesia with the patient or authorized representative who has indicated his/her understanding and acceptance.     Dental advisory given  Plan Discussed with: CRNA and Anesthesiologist  Anesthesia Plan Comments: (PAT note written by Myra Gianotti, PA-C. )       Anesthesia Quick Evaluation

## 2019-12-31 ENCOUNTER — Other Ambulatory Visit: Payer: Self-pay | Admitting: Neurosurgery

## 2019-12-31 NOTE — Progress Notes (Signed)
Anesthesia Chart Review:  Case: [id number] Date/Time: 01/01/20 0945   Procedure: MINIMALLY INVASIVE (MIS) TRANSFORAMINAL LUMBAR INTERBODY FUSION (TLIF) L4-L5 (Doctor available at 1000) (N/A ) - 3C   Anesthesia type: General   Pre-op diagnosis: SPONDYLOLISTHESIS, LUMBAR REGION   Location: [place] OR ROOM 18 / [place] OR   Surgeons: Vallarie Mare, MD      DISCUSSION: Patient is a 69 year old male scheduled for the above procedure.    History includes former smoker (quit 12/17/98), HTN, palpitations, CVA (~ 2000 by imaging), OSA (CPAP), skin cancer. He previously had a loop recorder placed to evaluate for arrhythmias given CVA history, now removed.    BP elevated at PAT 181/85 and 188/100. He was standing for his PAT visit given his back pain. He reported taking Toprol XL 50 mg that morning. He has been on two medication in the past, but reportedly PCP had stopped the second medication. He does monitor BP at home on occasion, with high SBP running up to ~ 170-180 with DBP < 100. He denied chest pain, SOB. His back/leg pain limits some activity/positions, but has recently been able to do yard work (mow, blow leaves) and clean gutters for his and his neighbor's houses without CV symptoms. He said Dr. Helene Kelp within the past few months. Records received from 11/10/19 visit with referral to specialist then for sacral pain with "L5 problem" on imaging. BP was 164/82 at that visit. Patient advised to monitor BP at home and contact PCP if BP readings remain elevated.  He is on Plavix due to CVA history. Last dose 12/28/19.   12/29/19 presurgical COVID-19 test negative. Anesthesia team to evaluate on the day of surgery.   ADDENDUM 12/31/19 10:28 AM:  Attempted to follow-up with Mr. Liou regarding home BP readings, but only able to leave a voice message. I have updated Nikki at Dr. Marcello Moores' office. He is to take his usual Toprol on the day of surgery and will get VS on arrival.   [facility] 12/31/19 12:21 PM: Patient  called back. BP this AM at 8:35 MA before Toprol was 174/95 with HR 61 and at 11:45 AM after Toprol 179/90 with HR 64. He is eating watermelon and limiting salt in hopes to improve BP. He confirms that he also goes by Binnie Rail (which is the name on his primary care records).  He tells me his son is a Immunologist in [place].   VS: BP (!) 188/100 Comment: notified Ebony Hail, PA-C  Pulse 72   Temp 36.9 C (Oral)   Resp 17   Ht 6' (1.829 m)   Wt 89.2 kg   SpO2 98%   BMI 26.66 kg/m    PROVIDERS: Ronita Hipps, MD is PCP    LABS: Labs reviewed: Acceptable for surgery. (all labs ordered are listed, but only abnormal results are displayed)  Labs Reviewed  SURGICAL PCR SCREEN  BASIC METABOLIC PANEL  CBC  TYPE AND SCREEN     EKG: 12/29/19: NSR   CV: Denied prior stress test and echo. He may have had a cardiac cath ~ 2000 when he underwent loop recorder implantation following CVA.   Past Medical History:  Diagnosis Date  . Arthritis   . Cancer ([place])    skin cancer, removed from bilateral arms  . History of kidney stones   . Hyperlipidemia   . Hypertension   . Palpitations   . Sleep apnea    CPAP  . Stroke ([place])    approx 2000, on  plavix. When found on CT, was told it was an "old stroke"    Past Surgical History:  Procedure Laterality Date  . APPENDECTOMY    . CARDIAC CATHETERIZATION     approx year 2000 @ [facility] after stroke  . CATARACT EXTRACTION, BILATERAL    . EYE SURGERY    . HERNIA REPAIR    . LOOP RECORDER INSERTION     approx 2000   . LOOP RECORDER REMOVAL     1 week after instertion, approx 2000  . NASAL SINUS SURGERY    . UMBILICAL HERNIA REPAIR      MEDICATIONS: . clopidogrel (PLAVIX) 75 MG tablet  . acetaminophen (TYLENOL) 650 MG CR tablet  . calcium carbonate (TUMS - DOSED IN MG ELEMENTAL CALCIUM) 500 MG chewable tablet  . Choline Fenofibrate (FENOFIBRIC ACID) 135 MG CPDR  . fexofenadine (ALLEGRA) 180 MG tablet  . ibuprofen (ADVIL) 200 MG  tablet  . Menthol, Topical Analgesic, (BIOFREEZE ROLL-ON EX)  . metoprolol succinate (TOPROL-XL) 50 MG 24 hr tablet  . Polyethyl Glycol-Propyl Glycol (SYSTANE OP)  . simvastatin (ZOCOR) 20 MG tablet  . sodium chloride (OCEAN) 0.65 % SOLN nasal spray  . traMADol (ULTRAM) 50 MG tablet   No current facility-administered medications for this encounter.    Myra Gianotti, PA-C Surgical Short Stay/Anesthesiology [facility] Phone [phone] [facility] Phone [phone] 12/31/2019 12:20 PM

## 2020-01-01 ENCOUNTER — Inpatient Hospital Stay (HOSPITAL_COMMUNITY): Payer: PPO

## 2020-01-01 ENCOUNTER — Inpatient Hospital Stay (HOSPITAL_COMMUNITY): Payer: PPO | Admitting: Vascular Surgery

## 2020-01-01 ENCOUNTER — Inpatient Hospital Stay (HOSPITAL_COMMUNITY)
Admission: RE | Disposition: A | Discharge status: Home / Self Care | Payer: Self-pay | Source: Home / Self Care | Attending: Neurosurgery

## 2020-01-01 ENCOUNTER — Inpatient Hospital Stay (HOSPITAL_COMMUNITY)
Admission: RE | Admit: 2020-01-01 | Discharge: 2020-01-02 | DRG: 455 | Disposition: A | Discharge status: Home / Self Care | Payer: PPO | Attending: Neurosurgery | Admitting: Neurosurgery

## 2020-01-01 ENCOUNTER — Inpatient Hospital Stay (HOSPITAL_COMMUNITY): Payer: PPO | Admitting: Certified Registered Nurse Anesthetist

## 2020-01-01 ENCOUNTER — Encounter (HOSPITAL_COMMUNITY): Payer: Self-pay

## 2020-01-01 DIAGNOSIS — Z79899 Other long term (current) drug therapy: Secondary | ICD-10-CM

## 2020-01-01 DIAGNOSIS — Z888 Allergy status to other drugs, medicaments and biological substances status: Secondary | ICD-10-CM

## 2020-01-01 DIAGNOSIS — Z885 Allergy status to narcotic agent status: Secondary | ICD-10-CM | POA: Diagnosis not present

## 2020-01-01 DIAGNOSIS — M5416 Radiculopathy, lumbar region: Secondary | ICD-10-CM | POA: Diagnosis present

## 2020-01-01 DIAGNOSIS — G4730 Sleep apnea, unspecified: Secondary | ICD-10-CM | POA: Diagnosis present

## 2020-01-01 DIAGNOSIS — M1990 Unspecified osteoarthritis, unspecified site: Secondary | ICD-10-CM | POA: Diagnosis present

## 2020-01-01 DIAGNOSIS — Z4889 Encounter for other specified surgical aftercare: Secondary | ICD-10-CM | POA: Diagnosis not present

## 2020-01-01 DIAGNOSIS — Z87442 Personal history of urinary calculi: Secondary | ICD-10-CM | POA: Diagnosis not present

## 2020-01-01 DIAGNOSIS — M48061 Spinal stenosis, lumbar region without neurogenic claudication: Secondary | ICD-10-CM | POA: Diagnosis present

## 2020-01-01 DIAGNOSIS — I10 Essential (primary) hypertension: Secondary | ICD-10-CM | POA: Diagnosis present

## 2020-01-01 DIAGNOSIS — E785 Hyperlipidemia, unspecified: Secondary | ICD-10-CM | POA: Diagnosis present

## 2020-01-01 DIAGNOSIS — M4306 Spondylolysis, lumbar region: Secondary | ICD-10-CM | POA: Diagnosis present

## 2020-01-01 DIAGNOSIS — Z8673 Personal history of transient ischemic attack (TIA), and cerebral infarction without residual deficits: Secondary | ICD-10-CM

## 2020-01-01 DIAGNOSIS — M4316 Spondylolisthesis, lumbar region: Secondary | ICD-10-CM | POA: Diagnosis present

## 2020-01-01 DIAGNOSIS — Z20822 Contact with and (suspected) exposure to covid-19: Secondary | ICD-10-CM | POA: Diagnosis present

## 2020-01-01 DIAGNOSIS — Z7902 Long term (current) use of antithrombotics/antiplatelets: Secondary | ICD-10-CM

## 2020-01-01 DIAGNOSIS — M4326 Fusion of spine, lumbar region: Secondary | ICD-10-CM | POA: Diagnosis not present

## 2020-01-01 DIAGNOSIS — Z419 Encounter for procedure for purposes other than remedying health state, unspecified: Secondary | ICD-10-CM

## 2020-01-01 DIAGNOSIS — Z87891 Personal history of nicotine dependence: Secondary | ICD-10-CM | POA: Diagnosis not present

## 2020-01-01 HISTORY — PX: TRANSFORAMINAL LUMBAR INTERBODY FUSION W/ MIS 1 LEVEL: SHX6145

## 2020-01-01 HISTORY — DX: Radiculopathy, lumbar region: M54.16

## 2020-01-01 LAB — POCT I-STAT, CHEM 8
BUN: 14 mg/dL (ref 8–23)
Calcium, Ion: 1.34 mmol/L (ref 1.15–1.40)
Chloride: 103 mmol/L (ref 98–111)
Creatinine, Ser: 0.8 mg/dL (ref 0.61–1.24)
Glucose, Bld: 188 mg/dL — ABNORMAL HIGH (ref 70–99)
HCT: 35 % — ABNORMAL LOW (ref 39.0–52.0)
Hemoglobin: 11.9 g/dL — ABNORMAL LOW (ref 13.0–17.0)
Potassium: 4.9 mmol/L (ref 3.5–5.1)
Sodium: 137 mmol/L (ref 135–145)
TCO2: 22 mmol/L (ref 22–32)

## 2020-01-01 LAB — ABO/RH: ABO/RH(D): O POS

## 2020-01-01 SURGERY — MINIMALLY INVASIVE (MIS) TRANSFORAMINAL LUMBAR INTERBODY FUSION (TLIF) 1 LEVEL
Anesthesia: General | Site: Spine Lumbar

## 2020-01-01 MED ORDER — ROCURONIUM BROMIDE 10 MG/ML (PF) SYRINGE
PREFILLED_SYRINGE | INTRAVENOUS | Status: AC
  Filled 2020-01-01: qty 10

## 2020-01-01 MED ORDER — POLYETHYLENE GLYCOL 3350 17 G PO PACK: 17.0000 g | PACK | Freq: Every day | ORAL | Status: DC | PRN

## 2020-01-01 MED ORDER — LACTATED RINGERS IV SOLN: INTRAVENOUS | Status: DC

## 2020-01-01 MED ORDER — PROPOFOL 10 MG/ML IV BOLUS
INTRAVENOUS | Status: AC
  Filled 2020-01-01: qty 20

## 2020-01-01 MED ORDER — CHLORHEXIDINE GLUCONATE CLOTH 2 % EX PADS: 6.0000 | MEDICATED_PAD | Freq: Once | CUTANEOUS | Status: DC

## 2020-01-01 MED ORDER — FENTANYL CITRATE (PF) 250 MCG/5ML IJ SOLN
INTRAMUSCULAR | Status: AC
  Filled 2020-01-01: qty 5

## 2020-01-01 MED ORDER — METOPROLOL TARTRATE 5 MG/5ML IV SOLN
INTRAVENOUS | Status: AC
  Filled 2020-01-01: qty 5

## 2020-01-01 MED ORDER — BUPIVACAINE HCL (PF) 0.5 % IJ SOLN
INTRAMUSCULAR | Status: DC | PRN
  Administered 2020-01-01: 30 mL

## 2020-01-01 MED ORDER — ALBUMIN HUMAN 5 % IV SOLN
INTRAVENOUS | Status: DC | PRN
Start: 2020-01-01 — End: 2020-01-01

## 2020-01-01 MED ORDER — EPHEDRINE SULFATE 50 MG/ML IJ SOLN
INTRAMUSCULAR | Status: DC | PRN
  Administered 2020-01-01 (×2): 10 mg via INTRAVENOUS

## 2020-01-01 MED ORDER — SODIUM CHLORIDE 0.9% FLUSH: 3.0000 mL | INTRAVENOUS | Status: DC | PRN

## 2020-01-01 MED ORDER — CEFAZOLIN SODIUM-DEXTROSE 2-4 GM/100ML-% IV SOLN
2.0000 g | INTRAVENOUS | Status: AC
  Administered 2020-01-01: 2 g via INTRAVENOUS

## 2020-01-01 MED ORDER — ROCURONIUM BROMIDE 10 MG/ML (PF) SYRINGE
PREFILLED_SYRINGE | INTRAVENOUS | Status: DC | PRN
  Administered 2020-01-01 (×2): 20 mg via INTRAVENOUS
  Administered 2020-01-01 (×2): 30 mg via INTRAVENOUS
  Administered 2020-01-01: 10 mg via INTRAVENOUS
  Administered 2020-01-01: 60 mg via INTRAVENOUS
  Administered 2020-01-01: 30 mg via INTRAVENOUS
  Administered 2020-01-01: 20 mg via INTRAVENOUS

## 2020-01-01 MED ORDER — MIDAZOLAM HCL 5 MG/5ML IJ SOLN
INTRAMUSCULAR | Status: DC | PRN
  Administered 2020-01-01: 1 mg via INTRAVENOUS

## 2020-01-01 MED ORDER — EPHEDRINE 5 MG/ML INJ
INTRAVENOUS | Status: AC
  Filled 2020-01-01: qty 20

## 2020-01-01 MED ORDER — DEXAMETHASONE SODIUM PHOSPHATE 10 MG/ML IJ SOLN
INTRAMUSCULAR | Status: DC | PRN
  Administered 2020-01-01: 10 mg via INTRAVENOUS

## 2020-01-01 MED ORDER — ONDANSETRON HCL 4 MG/2ML IJ SOLN
INTRAMUSCULAR | Status: DC | PRN
  Administered 2020-01-01: 4 mg via INTRAVENOUS

## 2020-01-01 MED ORDER — ROCURONIUM BROMIDE 10 MG/ML (PF) SYRINGE
PREFILLED_SYRINGE | INTRAVENOUS | Status: AC
  Filled 2020-01-01: qty 30

## 2020-01-01 MED ORDER — VASOPRESSIN 20 UNIT/ML IV SOLN
INTRAVENOUS | Status: AC
  Filled 2020-01-01: qty 1

## 2020-01-01 MED ORDER — FLEET ENEMA 7-19 GM/118ML RE ENEM: 1.0000 | ENEMA | Freq: Once | RECTAL | Status: DC | PRN

## 2020-01-01 MED ORDER — SODIUM CHLORIDE 0.9% FLUSH
3.0000 mL | Freq: Two times a day (BID) | INTRAVENOUS | Status: DC
  Administered 2020-01-01: 3 mL via INTRAVENOUS

## 2020-01-01 MED ORDER — SUGAMMADEX SODIUM 200 MG/2ML IV SOLN
INTRAVENOUS | Status: DC | PRN
  Administered 2020-01-01: 200 mg via INTRAVENOUS

## 2020-01-01 MED ORDER — DOCUSATE SODIUM 100 MG PO CAPS
100.0000 mg | ORAL_CAPSULE | Freq: Two times a day (BID) | ORAL | Status: DC
  Administered 2020-01-01 – 2020-01-02 (×2): 100 mg via ORAL
  Filled 2020-01-01 (×2): qty 1

## 2020-01-01 MED ORDER — CEFAZOLIN SODIUM-DEXTROSE 2-4 GM/100ML-% IV SOLN
INTRAVENOUS | Status: AC
  Filled 2020-01-01: qty 100

## 2020-01-01 MED ORDER — OXYCODONE-ACETAMINOPHEN 5-325 MG PO TABS
1.0000 | ORAL_TABLET | Freq: Four times a day (QID) | ORAL | Status: DC | PRN
  Administered 2020-01-01 – 2020-01-02 (×3): 2 via ORAL
  Filled 2020-01-01 (×3): qty 2

## 2020-01-01 MED ORDER — MORPHINE SULFATE (PF) 2 MG/ML IV SOLN
2.0000 mg | INTRAVENOUS | Status: DC | PRN
Start: 2020-01-01 — End: 2020-01-02

## 2020-01-01 MED ORDER — CEFAZOLIN SODIUM-DEXTROSE 2-4 GM/100ML-% IV SOLN
2.0000 g | Freq: Three times a day (TID) | INTRAVENOUS | Status: AC
  Administered 2020-01-01 – 2020-01-02 (×2): 2 g via INTRAVENOUS
  Filled 2020-01-01 (×2): qty 100

## 2020-01-01 MED ORDER — CALCIUM CHLORIDE 10 % IV SOLN
INTRAVENOUS | Status: DC | PRN
  Administered 2020-01-01: 100 mg via INTRAVENOUS

## 2020-01-01 MED ORDER — ONDANSETRON HCL 4 MG PO TABS: 4.0000 mg | ORAL_TABLET | Freq: Four times a day (QID) | ORAL | Status: DC | PRN

## 2020-01-01 MED ORDER — MIDAZOLAM HCL 2 MG/2ML IJ SOLN
INTRAMUSCULAR | Status: AC
  Filled 2020-01-01: qty 2

## 2020-01-01 MED ORDER — POTASSIUM CHLORIDE IN NACL 20-0.9 MEQ/L-% IV SOLN: INTRAVENOUS | Status: DC

## 2020-01-01 MED ORDER — SUCCINYLCHOLINE CHLORIDE 20 MG/ML IJ SOLN
INTRAMUSCULAR | Status: DC | PRN
  Administered 2020-01-01: 140 mg via INTRAVENOUS

## 2020-01-01 MED ORDER — PROPOFOL 10 MG/ML IV BOLUS
INTRAVENOUS | Status: DC | PRN
  Administered 2020-01-01: 160 mg via INTRAVENOUS

## 2020-01-01 MED ORDER — EPHEDRINE 5 MG/ML INJ
INTRAVENOUS | Status: AC
  Filled 2020-01-01: qty 10

## 2020-01-01 MED ORDER — LIDOCAINE-EPINEPHRINE 1 %-1:100000 IJ SOLN
INTRAMUSCULAR | Status: DC | PRN
  Administered 2020-01-01: 8 mL

## 2020-01-01 MED ORDER — CALCIUM CARBONATE ANTACID 500 MG PO CHEW: 1000.0000 mg | CHEWABLE_TABLET | Freq: Every day | ORAL | Status: DC | PRN

## 2020-01-01 MED ORDER — METOPROLOL SUCCINATE ER 50 MG PO TB24
50.0000 mg | ORAL_TABLET | Freq: Every day | ORAL | Status: DC
  Administered 2020-01-02: 50 mg via ORAL
  Filled 2020-01-01: qty 1

## 2020-01-01 MED ORDER — PHENYLEPHRINE HCL-NACL 10-0.9 MG/250ML-% IV SOLN
INTRAVENOUS | Status: DC | PRN
  Administered 2020-01-01: 25 ug/min via INTRAVENOUS

## 2020-01-01 MED ORDER — FENTANYL CITRATE (PF) 100 MCG/2ML IJ SOLN
25.0000 ug | INTRAMUSCULAR | Status: DC | PRN
  Administered 2020-01-01 (×2): 25 ug via INTRAVENOUS

## 2020-01-01 MED ORDER — PHENYLEPHRINE 40 MCG/ML (10ML) SYRINGE FOR IV PUSH (FOR BLOOD PRESSURE SUPPORT)
PREFILLED_SYRINGE | INTRAVENOUS | Status: AC
  Filled 2020-01-01: qty 10

## 2020-01-01 MED ORDER — PHENYLEPHRINE 40 MCG/ML (10ML) SYRINGE FOR IV PUSH (FOR BLOOD PRESSURE SUPPORT)
PREFILLED_SYRINGE | INTRAVENOUS | Status: AC
  Filled 2020-01-01: qty 40

## 2020-01-01 MED ORDER — ENOXAPARIN SODIUM 40 MG/0.4ML ~~LOC~~ SOLN
40.0000 mg | SUBCUTANEOUS | Status: DC
  Administered 2020-01-02: 40 mg via SUBCUTANEOUS
  Filled 2020-01-01: qty 0.4

## 2020-01-01 MED ORDER — MENTHOL 3 MG MT LOZG: 1.0000 | LOZENGE | OROMUCOSAL | Status: DC | PRN

## 2020-01-01 MED ORDER — BISACODYL 10 MG RE SUPP: 10.0000 mg | Freq: Every day | RECTAL | Status: DC | PRN

## 2020-01-01 MED ORDER — SIMVASTATIN 20 MG PO TABS
20.0000 mg | ORAL_TABLET | Freq: Every day | ORAL | Status: DC
  Administered 2020-01-02: 20 mg via ORAL
  Filled 2020-01-01: qty 1

## 2020-01-01 MED ORDER — FENOFIBRATE 160 MG PO TABS
160.0000 mg | ORAL_TABLET | Freq: Every day | ORAL | Status: DC
  Administered 2020-01-02: 160 mg via ORAL
  Filled 2020-01-01: qty 1

## 2020-01-01 MED ORDER — ACETAMINOPHEN 650 MG RE SUPP: 650.0000 mg | RECTAL | Status: DC | PRN

## 2020-01-01 MED ORDER — THROMBIN 5000 UNITS EX SOLR
CUTANEOUS | Status: AC
  Filled 2020-01-01: qty 5000

## 2020-01-01 MED ORDER — LIDOCAINE 2% (20 MG/ML) 5 ML SYRINGE
INTRAMUSCULAR | Status: AC
  Filled 2020-01-01: qty 5

## 2020-01-01 MED ORDER — FENOFIBRIC ACID 135 MG PO CPDR: 135.0000 mg | DELAYED_RELEASE_CAPSULE | Freq: Every day | ORAL | Status: DC

## 2020-01-01 MED ORDER — BUPIVACAINE HCL (PF) 0.5 % IJ SOLN
INTRAMUSCULAR | Status: AC
  Filled 2020-01-01: qty 30

## 2020-01-01 MED ORDER — FENTANYL CITRATE (PF) 100 MCG/2ML IJ SOLN
INTRAMUSCULAR | Status: AC
  Filled 2020-01-01: qty 2

## 2020-01-01 MED ORDER — PHENYLEPHRINE HCL (PRESSORS) 10 MG/ML IV SOLN
INTRAVENOUS | Status: DC | PRN
  Administered 2020-01-01: 400 ug via INTRAVENOUS
  Administered 2020-01-01: 80 ug via INTRAVENOUS
  Administered 2020-01-01 (×2): 120 ug via INTRAVENOUS
  Administered 2020-01-01: 200 ug via INTRAVENOUS

## 2020-01-01 MED ORDER — FENTANYL CITRATE (PF) 100 MCG/2ML IJ SOLN
INTRAMUSCULAR | Status: DC | PRN
  Administered 2020-01-01: 100 ug via INTRAVENOUS
  Administered 2020-01-01 (×5): 50 ug via INTRAVENOUS

## 2020-01-01 MED ORDER — ONDANSETRON HCL 4 MG/2ML IJ SOLN
INTRAMUSCULAR | Status: AC
  Filled 2020-01-01: qty 2

## 2020-01-01 MED ORDER — ACETAMINOPHEN 325 MG PO TABS: 650.0000 mg | ORAL_TABLET | ORAL | Status: DC | PRN

## 2020-01-01 MED ORDER — THROMBIN 5000 UNITS EX SOLR
OROMUCOSAL | Status: DC | PRN
  Administered 2020-01-01 (×2): 5 mL

## 2020-01-01 MED ORDER — PHENOL 1.4 % MT LIQD: 1.0000 | OROMUCOSAL | Status: DC | PRN

## 2020-01-01 MED ORDER — CHLORHEXIDINE GLUCONATE 0.12 % MT SOLN
OROMUCOSAL | Status: AC
  Administered 2020-01-01: 15 mL via OROMUCOSAL
  Filled 2020-01-01: qty 15

## 2020-01-01 MED ORDER — 0.9 % SODIUM CHLORIDE (POUR BTL) OPTIME
TOPICAL | Status: DC | PRN
  Administered 2020-01-01: 1000 mL

## 2020-01-01 MED ORDER — DEXAMETHASONE SODIUM PHOSPHATE 10 MG/ML IJ SOLN
INTRAMUSCULAR | Status: AC
  Filled 2020-01-01: qty 1

## 2020-01-01 MED ORDER — ONDANSETRON HCL 4 MG/2ML IJ SOLN: 4.0000 mg | Freq: Four times a day (QID) | INTRAMUSCULAR | Status: DC | PRN

## 2020-01-01 MED ORDER — LIDOCAINE 2% (20 MG/ML) 5 ML SYRINGE
INTRAMUSCULAR | Status: DC | PRN
  Administered 2020-01-01: 80 mg via INTRAVENOUS

## 2020-01-01 MED ORDER — CYCLOBENZAPRINE HCL 10 MG PO TABS
10.0000 mg | ORAL_TABLET | Freq: Three times a day (TID) | ORAL | Status: DC | PRN
  Administered 2020-01-01 – 2020-01-02 (×2): 10 mg via ORAL
  Filled 2020-01-01 (×2): qty 1

## 2020-01-01 MED ORDER — LIDOCAINE-EPINEPHRINE 1 %-1:100000 IJ SOLN
INTRAMUSCULAR | Status: AC
  Filled 2020-01-01: qty 1

## 2020-01-01 MED ORDER — CHLORHEXIDINE GLUCONATE 0.12 % MT SOLN: 15.0000 mL | Freq: Once | OROMUCOSAL | Status: AC

## 2020-01-01 MED ORDER — BUPIVACAINE LIPOSOME 1.3 % IJ SUSP
20.0000 mL | INTRAMUSCULAR | Status: AC
  Administered 2020-01-01: 20 mL
  Filled 2020-01-01: qty 20

## 2020-01-01 MED ORDER — SODIUM CHLORIDE 0.9 % IV SOLN: 250.0000 mL | INTRAVENOUS | Status: DC

## 2020-01-01 MED ORDER — ACETAMINOPHEN 10 MG/ML IV SOLN
INTRAVENOUS | Status: DC | PRN
  Administered 2020-01-01: 1000 mg via INTRAVENOUS

## 2020-01-01 MED ORDER — ORAL CARE MOUTH RINSE: 15.0000 mL | Freq: Once | OROMUCOSAL | Status: AC

## 2020-01-01 SURGICAL SUPPLY — 72 items
BAND RUBBER #18 3X1/16 STRL (MISCELLANEOUS) IMPLANT
BASKET BONE COLLECTION (BASKET) ×2 IMPLANT
BLADE CLIPPER SURG (BLADE) IMPLANT
BLADE SURG 11 STRL SS (BLADE) ×2 IMPLANT
BUR CARBIDE MATCH 3.0 (BURR) ×2 IMPLANT
BUR PRECISION FLUTE 5.0 (BURR) ×2 IMPLANT
CAGE SABLE 10X30 6-12 8D (Cage) ×2 IMPLANT
CANISTER SUCT 3000ML PPV (MISCELLANEOUS) ×2 IMPLANT
CNTNR URN SCR LID CUP LEK RST (MISCELLANEOUS) ×1 IMPLANT
CONT SPEC 4OZ STRL OR WHT (MISCELLANEOUS) ×1
COVER BACK TABLE 60X90IN (DRAPES) ×2 IMPLANT
COVER WAND RF STERILE (DRAPES) IMPLANT
DECANTER SPIKE VIAL GLASS SM (MISCELLANEOUS) IMPLANT
DERMABOND ADVANCED (GAUZE/BANDAGES/DRESSINGS) ×1
DERMABOND ADVANCED .7 DNX12 (GAUZE/BANDAGES/DRESSINGS) ×1 IMPLANT
DRAIN JACKSON PRATT 10MM FLAT (MISCELLANEOUS) ×2 IMPLANT
DRAPE 3/4 80X56 (DRAPES) ×2 IMPLANT
DRAPE C-ARM 42X72 X-RAY (DRAPES) ×2 IMPLANT
DRAPE C-ARMOR (DRAPES) ×2 IMPLANT
DRAPE LAPAROTOMY 100X72X124 (DRAPES) ×2 IMPLANT
DRAPE MICROSCOPE LEICA (MISCELLANEOUS) IMPLANT
DRSG OPSITE POSTOP 4X6 (GAUZE/BANDAGES/DRESSINGS) ×2 IMPLANT
DURAPREP 26ML APPLICATOR (WOUND CARE) ×2 IMPLANT
ELECT BLADE INSULATED 6.5IN (ELECTROSURGICAL) ×2
ELECT COATED BLADE 2.86 ST (ELECTRODE) ×2 IMPLANT
ELECT REM PT RETURN 9FT ADLT (ELECTROSURGICAL) ×2
ELECTRODE BLDE INSULATED 6.5IN (ELECTROSURGICAL) ×1 IMPLANT
ELECTRODE REM PT RTRN 9FT ADLT (ELECTROSURGICAL) ×1 IMPLANT
EVACUATOR SILICONE 100CC (DRAIN) IMPLANT
GAUZE 4X4 16PLY RFD (DISPOSABLE) IMPLANT
GAUZE SPONGE 4X4 12PLY STRL (GAUZE/BANDAGES/DRESSINGS) IMPLANT
GLOVE BIOGEL PI IND STRL 7.5 (GLOVE) ×1 IMPLANT
GLOVE BIOGEL PI INDICATOR 7.5 (GLOVE) ×1
GLOVE ECLIPSE 7.5 STRL STRAW (GLOVE) ×2 IMPLANT
GLOVE EXAM NITRILE XL STR (GLOVE) IMPLANT
GOWN STRL REUS W/ TWL LRG LVL3 (GOWN DISPOSABLE) ×1 IMPLANT
GOWN STRL REUS W/ TWL XL LVL3 (GOWN DISPOSABLE) ×1 IMPLANT
GOWN STRL REUS W/TWL 2XL LVL3 (GOWN DISPOSABLE) IMPLANT
GOWN STRL REUS W/TWL LRG LVL3 (GOWN DISPOSABLE) ×1
GOWN STRL REUS W/TWL XL LVL3 (GOWN DISPOSABLE) ×1
GRAFT BONE PROTEIOS XS 0.5CC (Orthopedic Implant) ×2 IMPLANT
HEMOSTAT POWDER KIT SURGIFOAM (HEMOSTASIS) ×4 IMPLANT
KIT BASIN OR (CUSTOM PROCEDURE TRAY) ×2 IMPLANT
KIT TURNOVER KIT B (KITS) ×2 IMPLANT
MILL MEDIUM DISP (BLADE) ×2 IMPLANT
NEEDLE BLUNT 16X1.5 OR ONLY (NEEDLE) ×2 IMPLANT
NEEDLE HYPO 18GX1.5 BLUNT FILL (NEEDLE) ×2 IMPLANT
NEEDLE HYPO 21X1.5 SAFETY (NEEDLE) ×2 IMPLANT
NEEDLE HYPO 22GX1.5 SAFETY (NEEDLE) ×2 IMPLANT
NEEDLE SPNL 18GX3.5 QUINCKE PK (NEEDLE) ×2 IMPLANT
NS IRRIG 1000ML POUR BTL (IV SOLUTION) ×2 IMPLANT
PACK LAMINECTOMY NEURO (CUSTOM PROCEDURE TRAY) ×2 IMPLANT
PAD ARMBOARD 7.5X6 YLW CONV (MISCELLANEOUS) ×6 IMPLANT
PUTTY GRAFTON DBF 6CC W/DELIVE (Putty) ×2 IMPLANT
ROD PREBENT 30MM LUMBAR (Rod) ×2 IMPLANT
ROD SPINAL 5.5X35 CP 4 TI (Rod) ×2 IMPLANT
SCREW POST SOLERA 6.5X55 F/5.5 (Screw) ×4 IMPLANT
SCREW SET SOLERA (Screw) ×4 IMPLANT
SCREW SET SOLERA TI5.5 (Screw) ×4 IMPLANT
SCREW SOLERA 6.5X50 (Screw) ×4 IMPLANT
SPONGE LAP 4X18 RFD (DISPOSABLE) IMPLANT
SPONGE SURGIFOAM ABS GEL 100 (HEMOSTASIS) IMPLANT
STAPLER VISISTAT 35W (STAPLE) ×2 IMPLANT
SUT MNCRL AB 4-0 PS2 18 (SUTURE) ×2 IMPLANT
SUT VIC AB 0 CT1 18XCR BRD8 (SUTURE) ×1 IMPLANT
SUT VIC AB 0 CT1 8-18 (SUTURE) ×1
SUT VIC AB 2-0 CP2 18 (SUTURE) ×4 IMPLANT
SYR 30ML LL (SYRINGE) ×2 IMPLANT
TOWEL GREEN STERILE (TOWEL DISPOSABLE) ×2 IMPLANT
TOWEL GREEN STERILE FF (TOWEL DISPOSABLE) ×2 IMPLANT
TRAY FOLEY MTR SLVR 16FR STAT (SET/KITS/TRAYS/PACK) ×2 IMPLANT
WATER STERILE IRR 1000ML POUR (IV SOLUTION) ×2 IMPLANT

## 2020-01-01 NOTE — Transfer of Care (Signed)
Immediate Anesthesia Transfer of Care Note  Patient: Benjamin Griffin  Procedure(s) Performed: TRANSFORAMINAL LUMBAR INTERBODY FUSION (TLIF) L4-L5 (N/A Spine Lumbar)  Patient Location: PACU  Anesthesia Type:General  Level of Consciousness: awake and alert   Airway & Oxygen Therapy: Patient Spontanous Breathing and Patient connected to face mask oxygen  Post-op Assessment: Report given to RN, Post -op Vital signs reviewed and stable and Patient moving all extremities X 4  Post vital signs: Reviewed and stable  Last Vitals:  Vitals Value Taken Time  BP 149/92 01/01/20 1617  Temp    Pulse 96 01/01/20 1622  Resp 14 01/01/20 1622  SpO2 99 % 01/01/20 1622  Vitals shown include unvalidated device data.  Last Pain:  Vitals:   01/01/20 0844  PainSc: 6       Patients Stated Pain Goal: 3 (99/96/72 2773)  Complications: No complications documented.

## 2020-01-01 NOTE — Op Note (Signed)
Procedure(s): TRANSFORAMINAL LUMBAR INTERBODY FUSION (TLIF) L4-L5 Procedure Note  Benjamin Griffin male 69 y.o. 01/01/2020  Procedure(s) and Anesthesia Type:    * TRANSFORAMINAL LUMBAR INTERBODY FUSION (TLIF) L4-L5 - General  Surgeon(s) and Role:    Marcello Moores, Dorcas Carrow, MD - Primary    * Dawley, Theodoro Doing, DO - Assisting   Indications:This is a 69 year old man who is a former smoker who presented to clinic with back pain and bilateral radiculopathy, slightly worse on the right.  His pain persisted despite nonsurgical measures.  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  There was associated spondylolysis and increased listhesis on flexion.  I Had a long discussion with patient regarding treatment options.  I discussed with him the option of a transforaminal lumbar interbody fusion at L4-5.  Risks, benefits, alternatives, expected convalescence were discussed with him.  He wished to proceed with surgery.  Risks discussed included, but were not limited to, bleeding, pain, infection, scar, pseudoarthrosis, neurologic deficit, spinal fluid leak, and death.  Informed consent was obtained.      Surgeon: Vallarie Mare   Assistants: Elwin Sleight, DO.  Please note there were no qualified trainees available to assist with the procedure.   Procedure Detail  1.  Right sided TRANSFORAMINAL LUMBAR INTERBODY FUSION (TLIF) L4-L5 2. Posterolateral arthrodesis, L4-5 3. Laminectomy of L4-5 and bilateral foraminotomy L4-5 4. Placement of expandable interbody cage, L4-5 5. Nonsegmental instrumentation with pedicle screw/rod construct L4-L5 6. Use of morselized allograft 7. Harvest of autograft  Patient was brought to the operating.  General anesthesia was induced and patient was intubated by the anesthesia service.  After appropriate lines and monitors were placed, patient was distant prone on a Wilson frame with all pressure points padded and eyes  protected.  His lower back was preprepped with alcohol and prepped and draped in sterile fashion.  X-ray was used to localize incision over his L4 and L5 levels.  1% lidocaine with epinephrine was injected skin.  A time was performed.  Preoperative antibiotics and dexamethasone were administered.  Incision made 10 blade and the monopolar electrocautery was used to open the fascia and dissect the paraspinous muscles off of the L4 and L5 lamina in subperiosteal fashion.  X-ray was used to confirm localization.  Dissection continued out laterally with exposure of the transverse processes bilaterally over the hypertrophied joint.  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  The L4 lamina was removed up to the pars defect and the superior portion of the L5 lamina was removed.  Hypertrophied facets was removed bilaterally, with the radical facetectomy on the right side.  A left-sided foraminotomy was performed.  In the right foramen, the exiting L4 nerve root was identified and was quite compressed in the narrowed foramen.  The disc space was entered with a small osteotome and using sequentially larger paddle distractors, the interbody space was appropriately reexpanded.  This resulted in increasing of foraminal height.  Disc shavers of increasing size were introduced into the disc space under C-arm guidance and used to begin the discectomy.  Additional disc material and cartilaginous portion of endplate was removed with curettes.  Autograft mixed with allograft was then packed into the interbody space and under C-arm guidance, a size 6 mm interbody cage was placed in the interbody space and expanded.  There was good restoration of height as well as reduction of the grade 2 listhesis.  L4 and L5 pedicle screws were then placed  bilaterally with the assistance of C arm navigation.  AP and lateral x-rays confirmed good placement of the pedicle screws.  30 and 35 mm interconnecting rods were then placed in the screw  heads and final tightened.  The remaining facet and transverse processes were decorticated with high-speed drill.  The wound was irrigated thoroughly and autograft mixed with allograft was placed in the lateral gutters bilaterally.  A 10 flat JP drain was placed in the subfascial space and tunneled out the skin.  The fascia was closed with 0 Vicryl stitches.  The dermal layer was closed with 2-0 Vicryl stitches.  The skin was closed staples.  A sterile dressing was then placed.  Patient then flipped supine.  All counts were correct and then surgery.  No complications were noted.    Findings: Isthmic spondylolisthesis, severe foraminal stenosis.  Excellent reduction achieved.  Estimated Blood Loss:  500 ml         Drains: JACKSON-PRATT (JP)               Specimens: none         Implants: Medtronic 6.5 x 55 mm screws at L4, 6.5 x 50 mm screws at L5, Globus SABLE interbody expandable cage, 6-12 mmx 10 mm x 30 mm, ProOs, DBM        Complications:  * No complications entered in OR log *         Disposition: PACU - hemodynamically stable.         Condition: stable

## 2020-01-01 NOTE — H&P (Signed)
CC: back and leg pain  HPI:     Patient is a 69 y.o. male presents with severe pain in bilateral buttocks and legs with accompanying back pain.  This pain progressed despite nonsurgical measures for the past 3 months. Patient wished to proceed with surgery    There are no problems to display for this patient.  Past Medical History:  Diagnosis Date  . Arthritis   . Cancer ([place])    skin cancer, removed from bilateral arms  . History of kidney stones   . Hyperlipidemia   . Hypertension   . Palpitations   . Sleep apnea    CPAP  . Stroke ([place])    approx 2000, on plavix. When found on CT, was told it was an "old stroke"    Past Surgical History:  Procedure Laterality Date  . APPENDECTOMY    . CARDIAC CATHETERIZATION     approx year 2000 @ [facility] after stroke  . CATARACT EXTRACTION, BILATERAL    . EYE SURGERY    . HERNIA REPAIR    . LOOP RECORDER INSERTION     approx 2000   . LOOP RECORDER REMOVAL     1 week after instertion, approx 2000  . NASAL SINUS SURGERY    . UMBILICAL HERNIA REPAIR      Medications Prior to Admission  Medication Sig Dispense Refill Last Dose  . acetaminophen (TYLENOL) 650 MG CR tablet Take 650 mg by mouth every 4 (four) hours as needed for pain.   12/31/2019 at Unknown time  . calcium carbonate (TUMS - DOSED IN MG ELEMENTAL CALCIUM) 500 MG chewable tablet Chew 1,000 mg by mouth daily as needed for indigestion or heartburn.   Past Week at Unknown time  . Choline Fenofibrate (FENOFIBRIC ACID) 135 MG CPDR Take 135 mg by mouth daily.   01/01/2020 at 0700  . fexofenadine (ALLEGRA) 180 MG tablet Take 180 mg by mouth daily as needed for allergies or rhinitis.   Past Month at Unknown time  . ibuprofen (ADVIL) 200 MG tablet Take 200-400 mg by mouth every 8 (eight) hours as needed.   Past Month at Unknown time  . Menthol, Topical Analgesic, (BIOFREEZE ROLL-ON EX) Apply 1 application topically daily as needed (leg pain).   Past Week at Unknown time  .  metoprolol succinate (TOPROL-XL) 50 MG 24 hr tablet Take 50 mg by mouth daily.   01/01/2020 at 0700  . Polyethyl Glycol-Propyl Glycol (SYSTANE OP) Place 1 drop into both eyes daily as needed (dry eyes).   Past Month at Unknown time  . simvastatin (ZOCOR) 20 MG tablet Take 20 mg by mouth daily.   01/01/2020 at 0700  . sodium chloride (OCEAN) 0.65 % SOLN nasal spray Place 1-2 sprays into both nostrils as needed for congestion.   Past Week at Unknown time  . traMADol (ULTRAM) 50 MG tablet Take 25 mg by mouth 2 (two) times daily.   Past Week at Unknown time  . clopidogrel (PLAVIX) 75 MG tablet Take 75 mg by mouth daily.   12/28/2019   Allergies  Allergen Reactions  . Meperidine Other (See Comments)    Sweating   . Vicodin [Hydrocodone-Acetaminophen] Nausea And Vomiting    Social History   Tobacco Use  . Smoking status: Former Smoker    Packs/day: 0.50    Years: 25.00    Pack years: 12.50    Types: Cigarettes    Quit date: 12/17/1998    Years since quitting: 21.0  .  Smokeless tobacco: Never Used  . Tobacco comment: quit in 2000  Substance Use Topics  . Alcohol use: Not Currently    Comment: 1 beer occasionally    History reviewed. No pertinent family history.   Review of Systems Pertinent items noted in HPI and remainder of comprehensive ROS otherwise negative.  Objective:   Patient Vitals for the past 8 hrs:  BP Temp Pulse Resp SpO2 Height Weight  01/01/20 0813 (!) 175/74 98.7 F (37.1 C) 82 18 96 % 6' (1.829 m) 89.2 kg   No intake/output data recorded. No intake/output data recorded.      General : Alert, cooperative, no distress, appears stated age   Head:  Normocephalic/atraumatic    Eyes: PERRL, conjunctiva/corneas clear, EOM's intact. Fundi could not be visualized Neck: Supple Chest:  Respirations unlabored Chest wall: no tenderness or deformity Heart: Regular rate and rhythm Abdomen: Soft, nontender and nondistended Extremities: warm and well-perfused Skin:  normal turgor, color and texture Neurologic:  Alert, oriented x 3.  Eyes open spontaneously. PERRL, EOMI, VFC, no facial droop. V1-3 intact.  No dysarthria, tongue protrusion symmetric.  CNII-XII intact. Normal strength, sensation and reflexes throughout.  No pronator drift, full strength in legs.  + SLR bilaterally       Data ReviewRadiology review: Grade 2 spondylolisthesis L4-5 with severe foraminal stenosis bilaterally  Assessment:   69 yo M with bilateral radiculopathy related to L4-5 spondylolisthesis and stenosis   Plan:   -L4-5 TLIF today -  I discussed in detail the procedure with Timothy.   Risks, benefits, alternatives, and expected convalescence was discussed.  Risks discussed included but were not limited to bleeding, pain, infection, scar, pseudoarthrosis, cerebrospinal fluid leak, neurologic deficit, paralysis, coma and death.  I also discussed with her the possibility of blood transfusion during surgery and consent to transfuse blood products if necessary was also obtained.  All questions and concerns were answered and understanding and agreement with the plan was verbalized.

## 2020-01-01 NOTE — Anesthesia Procedure Notes (Signed)
Procedure Name: Intubation Date/Time: 01/01/2020 11:02 AM Performed by: Inda Coke, CRNA Pre-anesthesia Checklist: Patient identified, Emergency Drugs available, Suction available and Patient being monitored Patient Re-evaluated:Patient Re-evaluated prior to induction Oxygen Delivery Method: Circle System Utilized Preoxygenation: Pre-oxygenation with 100% oxygen Induction Type: IV induction Ventilation: Mask ventilation without difficulty Laryngoscope Size: Mac and 4 Grade View: Grade II Tube type: Oral Tube size: 7.5 mm Number of attempts: 1 Airway Equipment and Method: Stylet and Oral airway Placement Confirmation: ETT inserted through vocal cords under direct vision,  positive ETCO2 and breath sounds checked- equal and bilateral Secured at: 23 cm Tube secured with: Tape Dental Injury: Teeth and Oropharynx as per pre-operative assessment

## 2020-01-01 NOTE — Anesthesia Postprocedure Evaluation (Signed)
Anesthesia Post Note  Patient: Benjamin Griffin  Procedure(s) Performed: TRANSFORAMINAL LUMBAR INTERBODY FUSION (TLIF) L4-L5 (N/A Spine Lumbar)     Patient location during evaluation: PACU Anesthesia Type: General Level of consciousness: awake Pain management: pain level controlled Vital Signs Assessment: post-procedure vital signs reviewed and stable Respiratory status: spontaneous breathing Cardiovascular status: stable Anesthetic complications: no   No complications documented.  Last Vitals:  Vitals:   01/01/20 1633 01/01/20 1645  BP: 134/79   Pulse: 88 88  Resp: 16 16  Temp:    SpO2: 95% 97%    Last Pain:  Vitals:   01/01/20 1645  PainSc: 0-No pain                 Graig Hessling

## 2020-01-02 ENCOUNTER — Other Ambulatory Visit: Payer: Self-pay

## 2020-01-02 MED ORDER — OXYCODONE-ACETAMINOPHEN 5-325 MG PO TABS
1.0000 | ORAL_TABLET | Freq: Four times a day (QID) | ORAL | 0 refills | Status: DC | PRN
Start: 2020-01-02 — End: 2020-10-05

## 2020-01-02 MED ORDER — CYCLOBENZAPRINE HCL 10 MG PO TABS: 10.0000 mg | ORAL_TABLET | Freq: Three times a day (TID) | ORAL | 0 refills | Status: DC | PRN

## 2020-01-02 MED ORDER — DOCUSATE SODIUM 100 MG PO CAPS: 100.0000 mg | ORAL_CAPSULE | Freq: Two times a day (BID) | ORAL | 2 refills | Status: DC

## 2020-01-02 MED ORDER — CEFAZOLIN SODIUM-DEXTROSE 1-4 GM/50ML-% IV SOLN: 1.0000 g | Freq: Three times a day (TID) | INTRAVENOUS | Status: DC

## 2020-01-02 MED FILL — Thrombin For Soln 5000 Unit: CUTANEOUS | Qty: 5000 | Status: AC

## 2020-01-02 NOTE — Progress Notes (Signed)
Orthopedic Tech Progress Note Patient Details:  [name] Nov 04, 1950 [id number] Patient has brace Patient ID: Benjamin Griffin, male   DOB: 06/06/1950, 69 y.o.   MRN: [id number]   Ellouise Newer 01/02/2020, 8:48 AM

## 2020-01-02 NOTE — Progress Notes (Signed)
Patient is discharged from room 3C08 at this time. Alert and in stable condition. IV site d/c'd and instructions read to patient and spouse with understanding verbalized and all questions answered. Left unit via wheelchair with all belongings at side. 

## 2020-01-02 NOTE — Evaluation (Signed)
Occupational Therapy Evaluation Patient Details Name: Benjamin Griffin MRN: [id number] DOB: June 30, 1950 Today's Date: 01/02/2020    History of Present Illness  69 yo male s/p lumbar 4-5 fusion. PMH including arthritis, HTN, and CVA.    Clinical Impression   PTA, pt was living with his wife and was independent. Currently, pt performing ADLs and functional mobility at Mod I level. Provided education and handout on back precautions, bed mobility, grooming, brace management, LB ADLs, toileting, and shower transfer; pt demonstrated understanding. Answered all pt questions. Recommend dc home once medically stable per physician. All acute OT needs met and will sign off. Thank you.    Follow Up Recommendations  No OT follow up    Equipment Recommendations  None recommended by OT    Recommendations for Other Services       Precautions / Restrictions Precautions Precautions: Back Precaution Booklet Issued: Yes (comment) Precaution Comments: Provided education on back precuations and compensatory technqiues for ADLs Required Braces or Orthoses: Spinal Brace Spinal Brace: Lumbar corset Restrictions Weight Bearing Restrictions: No      Mobility Bed Mobility Overal bed mobility: Modified Independent             General bed mobility comments: Use of log roll technique. Increased time    Transfers Overall transfer level: Modified independent                    Balance Overall balance assessment: No apparent balance deficits (not formally assessed)                                         ADL either performed or assessed with clinical judgement   ADL Overall ADL's : Modified independent                                       General ADL Comments: Pt performing ADLs and functional mobility at Mod I level with increased time as needed. Providing pt with handout nad education on back precuations, bed mobility, brace management, LB ADLs,  grooming, toileting, and shower t/f. Also practicing stairs and pt demonstrating understanding     Vision         Perception     Praxis      Pertinent Vitals/Pain Pain Assessment: No/denies pain     Hand Dominance Right   Extremity/Trunk Assessment Upper Extremity Assessment Upper Extremity Assessment: Overall WFL for tasks assessed   Lower Extremity Assessment Lower Extremity Assessment: Overall WFL for tasks assessed   Cervical / Trunk Assessment Cervical / Trunk Assessment: Other exceptions Cervical / Trunk Exceptions: s/p lumbar sx   Communication Communication Communication: No difficulties   Cognition Arousal/Alertness: Awake/alert Behavior During Therapy: WFL for tasks assessed/performed Overall Cognitive Status: Within Functional Limits for tasks assessed                                     General Comments  bandage and drain in place    Exercises     Shoulder Instructions      Home Living Family/patient expects to be discharged to:: Private residence Living Arrangements: Spouse/significant other Available Help at Discharge: Family;Available 24 hours/day Type of Home: House Home Access: Stairs to enter [employer] of Steps:  3 Entrance Stairs-Rails: Left Home Layout: One level     Bathroom Shower/Tub: Occupational psychologist: Standard     Home Equipment: Shower seat - built in          Prior Functioning/Environment Level of Independence: Independent        Comments: ADLs, IADLs, driving, and enjoys his hobbies. "I am a jack of all trades and very active"        OT Problem List: Decreased strength;Decreased activity tolerance;Pain      OT Treatment/Interventions:      OT Goals(Current goals can be found in the care plan section) Acute Rehab OT Goals Patient Stated Goal: "I would love to go home today if that is fine with the doctor" OT Goal Formulation: All assessment and education complete, DC  therapy  OT Frequency:     Barriers to D/C:            Co-evaluation              AM-PAC OT "6 Clicks" Daily Activity     Outcome Measure Help from another person eating meals?: None Help from another person taking care of personal grooming?: None Help from another person toileting, which includes using toliet, bedpan, or urinal?: None Help from another person bathing (including washing, rinsing, drying)?: None Help from another person to put on and taking off regular upper body clothing?: None Help from another person to put on and taking off regular lower body clothing?: None 6 Click Score: 24   End of Session Equipment Utilized During Treatment: Back brace Nurse Communication: Mobility status  Activity Tolerance: Patient tolerated treatment well Patient left: in chair;with call bell/phone within reach  OT Visit Diagnosis: Unsteadiness on feet (R26.81);Other abnormalities of gait and mobility (R26.89);Muscle weakness (generalized) (M62.81)                Time: 3825-0539 OT Time Calculation (min): 24 min Charges:  OT General Charges $OT Visit: 1 Visit OT Evaluation $OT Eval Low Complexity: 1 Low OT Treatments $Self Care/Home Management : 8-22 mins  Benjamin Griffin MSOT, OTR/L Acute Rehab Pager: [phone] Office: [place] 01/02/2020, 9:03 AM

## 2020-01-02 NOTE — Discharge Summary (Signed)
  Physician Discharge Summary  Patient ID: Benjamin Griffin MRN: [id number] DOB/AGE: 1950/09/13 69 y.o.  Admit date: 01/01/2020 Discharge date: 01/02/2020  Admission Diagnoses:  L4-5 spondylolisthesis with radiculopathy  Discharge Diagnoses:  Same Active Problems:   Lumbar radiculopathy   Discharged Condition: Stable  Hospital Course:  Benjamin Griffin is a 69 y.o. male with bilateral radiculopathy and L4-5 spondylolisthesis who underwent elective L4-5 TLIF.  He was mobilized postoperatively.  On POD#1, he was tolerating a regular diet, ambulating without difficulty, and had complete resolution of his preoperative pain.  His drain was removed and he was deemed ready for discharge.  Treatments: Surgery - L4-5 TLIF  Discharge Exam: Blood pressure (!) 150/61, pulse 81, temperature 98.4 F (36.9 C), temperature source Oral, resp. rate 16, height 6' (1.829 m), weight 89.2 kg, SpO2 99 %. Awake, alert, oriented Speech fluent, appropriate CN grossly intact Dressing c/d 5/5 BUE/BLE Wound c/d/i  Disposition: Discharge disposition: 01-Home or Self Care       Discharge Instructions    Incentive spirometry RT   Complete by: As directed      Allergies as of 01/02/2020      Reactions   Meperidine Other (See Comments)   Sweating    Vicodin [hydrocodone-acetaminophen] Nausea And Vomiting      Medication List    STOP taking these medications   clopidogrel 75 MG tablet Commonly known as: PLAVIX   ibuprofen 200 MG tablet Commonly known as: ADVIL   traMADol 50 MG tablet Commonly known as: ULTRAM     TAKE these medications   acetaminophen 650 MG CR tablet Commonly known as: TYLENOL Take 650 mg by mouth every 4 (four) hours as needed for pain.   BIOFREEZE ROLL-ON EX Apply 1 application topically daily as needed (leg pain).   calcium carbonate 500 MG chewable tablet Commonly known as: TUMS - dosed in mg elemental calcium Chew 1,000 mg by mouth daily as needed for  indigestion or heartburn.   cyclobenzaprine 10 MG tablet Commonly known as: FLEXERIL Take 1 tablet (10 mg total) by mouth 3 (three) times daily as needed for muscle spasms.   docusate sodium 100 MG capsule Commonly known as: COLACE Take 1 capsule (100 mg total) by mouth 2 (two) times daily.   Fenofibric Acid 135 MG Cpdr Take 135 mg by mouth daily.   fexofenadine 180 MG tablet Commonly known as: ALLEGRA Take 180 mg by mouth daily as needed for allergies or rhinitis.   metoprolol succinate 50 MG 24 hr tablet Commonly known as: TOPROL-XL Take 50 mg by mouth daily.   oxyCODONE-acetaminophen 5-325 MG tablet Commonly known as: PERCOCET/ROXICET Take 1-2 tablets by mouth every 6 (six) hours as needed for moderate pain or severe pain.   simvastatin 20 MG tablet Commonly known as: ZOCOR Take 20 mg by mouth daily.   sodium chloride 0.65 % Soln nasal spray Commonly known as: OCEAN Place 1-2 sprays into both nostrils as needed for congestion.   SYSTANE OP Place 1 drop into both eyes daily as needed (dry eyes).        Signed: ASAIAH SCARBER 01/02/2020, 9:18 AM

## 2020-01-02 NOTE — Discharge Instructions (Signed)

## 2020-01-02 NOTE — Progress Notes (Signed)
PT Cancellation Note  Patient Details Name: Benjamin Griffin MRN: [id number] DOB: Oct 16, 1950   Cancelled Treatment:    Reason Eval/Treat Not Completed: PT screened, no needs identified, will sign off. PT observes pt ambulating independently in hall with OT. Per discussion with OT and patient the pt is performing all mobility at a modI or independently level and demonstrates good knowledge of back precautions and brace use. Pt has no acute PT needs at this time.   Zenaida Niece 01/02/2020, 9:26 AM

## 2020-01-02 NOTE — Progress Notes (Signed)
Orthopedic Tech Progress Note Patient Details:  Albin Duckett August 31, 1950 [id number]  Patient ID: [name], male   DOB: 1950-03-12, 69 y.o.   MRN: [id number] Called in order to [place] 01/02/2020, 7:23 AM

## 2020-01-02 NOTE — Progress Notes (Signed)
Subjective: NAEs o/n  Objective: Vital signs in last 24 hours: Temp:  [97.6 F (36.4 C)-98.9 F (37.2 C)] 98.4 F (36.9 C) (12/17 0814) Pulse Rate:  [66-98] 81 (12/17 0814) Resp:  [10-20] 16 (12/17 0814) BP: (123-173)/(61-92) 150/61 (12/17 0814) SpO2:  [93 %-100 %] 99 % (12/17 0814)  Intake/Output from previous day: 12/16 0701 - 12/17 0700 In: 2700 [I.V.:2000; IV Piggyback:700] Out: 2630 [Urine:950; Drains:180; Blood:1500] Intake/Output this shift: Total I/O In: -  Out: 30 [Drains:30]  Awake, alert Dressing c/d 5/5 strength LEs  Lab Results: Recent Labs    01/01/20 1606  HGB 11.9*  HCT 35.0*   BMET Recent Labs    01/01/20 1606  NA 137  K 4.9  CL 103  GLUCOSE 188*  BUN 14  CREATININE 0.80    Studies/Results: DG Lumbar Spine 2-3 Views  Result Date: 01/01/2020 CLINICAL DATA:  Surgery, elective. Additional history provided: L4-L5 TLIF. Provided fluoroscopy time 1 minutes, 13 seconds (51.44 mGy). EXAM: LUMBAR SPINE - 2-3 VIEW; DG C-ARM 1-60 MIN COMPARISON:  CT abdomen/pelvis 03/01/2009 FINDINGS: PA and lateral view intraoperative fluoroscopic images of the lumbar spine are submitted, 2 images total. For the purposes of this examination, the lowest well-formed intervertebral disc space is designated L5-S1. The intraoperative fluoroscopic images demonstrate bilateral pedicle screws at L4 and L5. Vertical interconnecting rods were not present at the time the images were taken. L4-L5 interbody device. Overlying retractors. IMPRESSION: Two intraoperative fluoroscopic images from L4-L5 posterior spinal fusion, as described. Electronically Signed   By: Kellie Simmering DO   On: 01/01/2020 16:18   DG C-Arm 1-60 Min  Result Date: 01/01/2020 CLINICAL DATA:  Surgery, elective. Additional history provided: L4-L5 TLIF. Provided fluoroscopy time 1 minutes, 13 seconds (51.44 mGy). EXAM: LUMBAR SPINE - 2-3 VIEW; DG C-ARM 1-60 MIN COMPARISON:  CT abdomen/pelvis 03/01/2009 FINDINGS: PA  and lateral view intraoperative fluoroscopic images of the lumbar spine are submitted, 2 images total. For the purposes of this examination, the lowest well-formed intervertebral disc space is designated L5-S1. The intraoperative fluoroscopic images demonstrate bilateral pedicle screws at L4 and L5. Vertical interconnecting rods were not present at the time the images were taken. L4-L5 interbody device. Overlying retractors. IMPRESSION: Two intraoperative fluoroscopic images from L4-L5 posterior spinal fusion, as described. Electronically Signed   By: Kellie Simmering DO   On: 01/01/2020 16:18    Assessment/Plan: S/p L4-5 TLIF - mobilize - likely [place] 01/02/2020, 9:04 AM

## 2020-01-06 ENCOUNTER — Encounter (HOSPITAL_COMMUNITY): Payer: Self-pay | Admitting: Neurosurgery

## 2020-01-06 DIAGNOSIS — R55 Syncope and collapse: Secondary | ICD-10-CM | POA: Diagnosis not present

## 2020-01-06 DIAGNOSIS — Z79899 Other long term (current) drug therapy: Secondary | ICD-10-CM | POA: Diagnosis not present

## 2020-01-06 DIAGNOSIS — I480 Paroxysmal atrial fibrillation: Secondary | ICD-10-CM | POA: Diagnosis not present

## 2020-01-06 DIAGNOSIS — R002 Palpitations: Secondary | ICD-10-CM | POA: Diagnosis not present

## 2020-01-06 DIAGNOSIS — I4891 Unspecified atrial fibrillation: Secondary | ICD-10-CM | POA: Diagnosis not present

## 2020-01-06 DIAGNOSIS — E785 Hyperlipidemia, unspecified: Secondary | ICD-10-CM | POA: Diagnosis not present

## 2020-01-06 DIAGNOSIS — I361 Nonrheumatic tricuspid (valve) insufficiency: Secondary | ICD-10-CM | POA: Diagnosis not present

## 2020-01-06 DIAGNOSIS — I10 Essential (primary) hypertension: Secondary | ICD-10-CM | POA: Diagnosis not present

## 2020-01-06 DIAGNOSIS — G9389 Other specified disorders of brain: Secondary | ICD-10-CM | POA: Diagnosis not present

## 2020-01-06 DIAGNOSIS — I482 Chronic atrial fibrillation, unspecified: Secondary | ICD-10-CM | POA: Diagnosis not present

## 2020-01-06 DIAGNOSIS — R079 Chest pain, unspecified: Secondary | ICD-10-CM | POA: Diagnosis not present

## 2020-01-06 DIAGNOSIS — Z8673 Personal history of transient ischemic attack (TIA), and cerebral infarction without residual deficits: Secondary | ICD-10-CM | POA: Diagnosis not present

## 2020-01-06 DIAGNOSIS — M5136 Other intervertebral disc degeneration, lumbar region: Secondary | ICD-10-CM | POA: Diagnosis not present

## 2020-01-06 DIAGNOSIS — I119 Hypertensive heart disease without heart failure: Secondary | ICD-10-CM | POA: Diagnosis not present

## 2020-01-07 DIAGNOSIS — I480 Paroxysmal atrial fibrillation: Secondary | ICD-10-CM | POA: Diagnosis not present

## 2020-01-07 DIAGNOSIS — I119 Hypertensive heart disease without heart failure: Secondary | ICD-10-CM | POA: Diagnosis not present

## 2020-01-07 DIAGNOSIS — E785 Hyperlipidemia, unspecified: Secondary | ICD-10-CM | POA: Diagnosis not present

## 2020-01-08 DIAGNOSIS — E785 Hyperlipidemia, unspecified: Secondary | ICD-10-CM | POA: Diagnosis not present

## 2020-01-08 DIAGNOSIS — I119 Hypertensive heart disease without heart failure: Secondary | ICD-10-CM | POA: Diagnosis not present

## 2020-01-08 DIAGNOSIS — I480 Paroxysmal atrial fibrillation: Secondary | ICD-10-CM | POA: Diagnosis not present

## 2020-01-19 ENCOUNTER — Telehealth: Payer: Self-pay | Admitting: Cardiology

## 2020-01-19 NOTE — Telephone Encounter (Signed)
His symptoms are not due to flecainide  I would advise him to see his family doctor for these respiratory symptoms

## 2020-01-19 NOTE — Telephone Encounter (Signed)
Pt c/o medication issue:  1. Name of Medication: Flecainide 100mg   2. How are you currently taking this medication (dosage and times per day)? 1/2 tablet in the morning and 1/2 tablet in the evening.   3. Are you having a reaction (difficulty breathing--STAT)? yes  4. What is your medication issue? Patient states that Dr. prescribed him this medication in the hospital. He states it is causing him to have a low grade fever, coughing and headache and states he stopped taking it last night. He wants to know if Dr. Dulce Sellar see's an issue with this. Please advise.

## 2020-01-19 NOTE — Telephone Encounter (Signed)
Spoke to the patient just now and let him know Dr. Munley's recommendations. He verbalizes understanding and thanks me for the call back.  

## 2020-01-27 DIAGNOSIS — Z6827 Body mass index (BMI) 27.0-27.9, adult: Secondary | ICD-10-CM | POA: Diagnosis not present

## 2020-01-27 DIAGNOSIS — J309 Allergic rhinitis, unspecified: Secondary | ICD-10-CM | POA: Diagnosis not present

## 2020-02-03 ENCOUNTER — Telehealth: Payer: Self-pay | Admitting: Cardiology

## 2020-02-03 MED ORDER — ELIQUIS 5 MG PO TABS: 5.0000 mg | ORAL_TABLET | Freq: Two times a day (BID) | ORAL | 0 refills | Status: DC

## 2020-02-03 NOTE — Telephone Encounter (Signed)
Yes please renew his Eliquis, thank you for making sure he has office follow-up scheduled.

## 2020-02-03 NOTE — Telephone Encounter (Signed)
    Pt c/o medication issue:  1. Name of Medication: Eliquis 5 mg  2. How are you currently taking this medication (dosage and times per day)? Taking 2 tablets daily  3. Are you having a reaction (difficulty breathing--STAT)?   4. What is your medication issue? Pt said his pcp told him Dr. Bettina Gavia needs to refill his meds for him. It is not on his meds list. He gave his pharmacy: [facility], [place]

## 2020-02-03 NOTE — Telephone Encounter (Signed)
Refill sent to [facility] for Eliquis per Dr. Bettina Gavia.

## 2020-02-16 DIAGNOSIS — I639 Cerebral infarction, unspecified: Secondary | ICD-10-CM | POA: Insufficient documentation

## 2020-02-16 DIAGNOSIS — R002 Palpitations: Secondary | ICD-10-CM | POA: Insufficient documentation

## 2020-02-16 DIAGNOSIS — M1990 Unspecified osteoarthritis, unspecified site: Secondary | ICD-10-CM | POA: Insufficient documentation

## 2020-02-16 DIAGNOSIS — C801 Malignant (primary) neoplasm, unspecified: Secondary | ICD-10-CM | POA: Insufficient documentation

## 2020-02-16 DIAGNOSIS — Z87442 Personal history of urinary calculi: Secondary | ICD-10-CM | POA: Insufficient documentation

## 2020-02-16 DIAGNOSIS — G4730 Sleep apnea, unspecified: Secondary | ICD-10-CM | POA: Insufficient documentation

## 2020-02-16 DIAGNOSIS — I10 Essential (primary) hypertension: Secondary | ICD-10-CM | POA: Insufficient documentation

## 2020-02-16 DIAGNOSIS — E785 Hyperlipidemia, unspecified: Secondary | ICD-10-CM | POA: Insufficient documentation

## 2020-02-17 DIAGNOSIS — M4316 Spondylolisthesis, lumbar region: Secondary | ICD-10-CM | POA: Diagnosis not present

## 2020-03-01 NOTE — Progress Notes (Signed)
Cardiology Office Note:    Date:  03/02/2020   ID:  [name], DOB 1950/08/14, MRN [id number]  PCP:  Ronita Hipps, MD  Cardiologist:  Shirlee More, MD    Referring MD: Ronita Hipps, MD    ASSESSMENT:    1. Paroxysmal atrial fibrillation (HCC)   2. Chronic anticoagulation   3. High risk medication use   4. Hypertensive heart disease with heart failure ([place])    PLAN:    In order of problems listed above:  1. He is doing well is on low-dose flecainide tolerating it no recurrence of his atrial fibrillation and continues his anticoagulant. 2. BP is not well controlled as can add an ARB except for his chronic cough and agreed to go ahead and start a calcium channel blocker trend blood pressure 2 weeks come back and check renal function and bring a list of his blood pressures to review   Next appointment: 6 weeks to review blood pressure   Medication Adjustments/Labs and Tests Ordered: Current medicines are reviewed at length with the patient today.  Concerns regarding medicines are outlined above.  No orders of the defined types were placed in this encounter.  No orders of the defined types were placed in this encounter.   Chief Complaint  Patient presents with  . Hypertension  . Atrial Fibrillation  . Anticoagulation    History of Present Illness:    Benjamin Griffin is a 70 y.o. male with a hx of hypertensive heart disease and paroxysmal atrial fibrillation last seen [facility] admission 01/06/2020. He is admitted with atrial fibrillation hypertensive heart disease dislipidemia discharged on flecainide and anticoagulant along with beta-blocker. Laboratory studies include a hemoglobin of 9.4 platelets 185,000 creatinine 0.7 potassium 3.8 troponins were minimally elevated flat curve not indicative of acute coronary syndrome proBNP was 1380. His CT of the head which showed remote right cerebral infarction no acute findings. EKG performed 12/23 showed sinus rhythm  and was normal. Initially there was concern of bradycardia I received a phone call about a 10-second pause in the ED when I reviewed the strips after receiving diltiazem on top of long-acting beta-blocker Toprol he had a 3 to 3-1/2-second pause and brief junctional rhythm and then converted to sinus in the ED. At discharge I felt that the best approach was to continue combined antiplatelet clopidogrel and anticoagulant along with his flecainide. He was given a prescription for Lopressor to take as needed for rates of greater than 115 bpm. He had an echocardiogram which showed mild concentric LVH EF 60 to 65% normal left ventricular filling pressure and moderate to severe left atrial enlargement. This was his first documented episode of atrial fibrillation.                                                                               Compliance with diet, lifestyle and medications: Yes  He was swallowing steroid improved from the time of his surgery and documentation of atrial fibrillation.  He has the alive core cardia mobile device screens his heart every day I reviewed strips from his phone to and he has no evidence of recurrence.  No bleeding from his anticoagulant no muscle pain  or weakness from his statin no chest pain palpitation or syncope.  Unfortunately his blood pressure has been running in the range of 1 17-5 60 systolic.  I was going to put him on ARB until he told me he has a chronic cough in which she has a calcium channel blocker. Past Medical History:  Diagnosis Date  . Arthritis   . Cancer ([place])    skin cancer, removed from bilateral arms  . History of kidney stones   . Hyperlipidemia   . Hypertension   . Palpitations   . Sleep apnea    CPAP  . Stroke ([place])    approx 2000, on plavix. When found on CT, was told it was an "old stroke"    Past Surgical History:  Procedure Laterality Date  . APPENDECTOMY    . CARDIAC CATHETERIZATION     approx year 2000 @ [facility] after stroke   . CATARACT EXTRACTION, BILATERAL    . EYE SURGERY    . HERNIA REPAIR    . LOOP RECORDER INSERTION     approx 2000   . LOOP RECORDER REMOVAL     1 week after instertion, approx 2000  . NASAL SINUS SURGERY    . TRANSFORAMINAL LUMBAR INTERBODY FUSION W/ MIS 1 LEVEL N/A 01/01/2020   Procedure: TRANSFORAMINAL LUMBAR INTERBODY FUSION (TLIF) L4-L5;  Surgeon: Vallarie Mare, MD;  Location: [facility];  Service: Neurosurgery;  Laterality: N/A;  TRANSFORAMINAL LUMBAR INTERBODY FUSION (TLIF) L4-L5   . UMBILICAL HERNIA REPAIR      Current Medications: Current Meds  Medication Sig  . acetaminophen (TYLENOL) 650 MG CR tablet Take 650 mg by mouth every 4 (four) hours as needed for pain.  . calcium carbonate (TUMS - DOSED IN MG ELEMENTAL CALCIUM) 500 MG chewable tablet Chew 1,000 mg by mouth daily as needed for indigestion or heartburn.  . Choline Fenofibrate (FENOFIBRIC ACID) 135 MG CPDR Take 135 mg by mouth daily.  . cyclobenzaprine (FLEXERIL) 10 MG tablet Take 1 tablet (10 mg total) by mouth 3 (three) times daily as needed for muscle spasms.  Marland Kitchen docusate sodium (COLACE) 100 MG capsule Take 1 capsule (100 mg total) by mouth 2 (two) times daily.  Marland Kitchen ELIQUIS 5 MG TABS tablet Take 1 tablet (5 mg total) by mouth 2 (two) times daily.  . fexofenadine (ALLEGRA) 180 MG tablet Take 180 mg by mouth daily as needed for allergies or rhinitis.  . flecainide (TAMBOCOR) 50 MG tablet Take 50 mg by mouth 2 (two) times daily.  . Menthol, Topical Analgesic, (BIOFREEZE ROLL-ON EX) Apply 1 application topically daily as needed (leg pain).  . metoprolol succinate (TOPROL-XL) 50 MG 24 hr tablet Take 50 mg by mouth daily.  Marland Kitchen oxyCODONE-acetaminophen (PERCOCET/ROXICET) 5-325 MG tablet Take 1-2 tablets by mouth every 6 (six) hours as needed for moderate pain or severe pain.  Vladimir Faster Glycol-Propyl Glycol (SYSTANE OP) Place 1 drop into both eyes daily as needed (dry eyes).  . simvastatin (ZOCOR) 20 MG tablet Take 20 mg by mouth  daily.  . sodium chloride (OCEAN) 0.65 % SOLN nasal spray Place 1-2 sprays into both nostrils as needed for congestion.     Allergies:   Meperidine and Vicodin [hydrocodone-acetaminophen]   Social History   Socioeconomic History  . Marital status: Married    Spouse name: Not on file  . Number of children: Not on file  . Years of education: Not on file  . Highest education level: Not on file  Occupational  History  . Not on file  Tobacco Use  . Smoking status: Former Smoker    Packs/day: 0.50    Years: 25.00    Pack years: 12.50    Types: Cigarettes    Quit date: 12/17/1998    Years since quitting: 21.2  . Smokeless tobacco: Never Used  . Tobacco comment: quit in 2000  Vaping Use  . Vaping Use: Never used  Substance and Sexual Activity  . Alcohol use: Not Currently    Comment: 1 beer occasionally  . Drug use: Never  . Sexual activity: Not on file  Other Topics Concern  . Not on file  Social History Narrative   ** Merged History Encounter **       Social Determinants of Health   Financial Resource Strain: Not on file  Food Insecurity: Not on file  Transportation Needs: Not on file  Physical Activity: Not on file  Stress: Not on file  Social Connections: Not on file     Family History: The patient's family history is not on file. ROS:   Please see the history of present illness.    All other systems reviewed and are negative.  EKGs/Labs/Other Studies Reviewed:    The following studies were reviewed today: Two home telemetry strips to reviewed sinus rhythm normal QT interval narrow QRS normal  Physical Exam:    VS:  BP (!) 168/88 (BP Location: Right Arm, Patient Position: Sitting, Cuff Size: Normal)   Pulse 68   Ht 6' (1.829 m)   Wt 201 lb 12.8 oz (91.5 kg)   SpO2 97%   BMI 27.37 kg/m     Wt Readings from Last 3 Encounters:  03/02/20 201 lb 12.8 oz (91.5 kg)  01/01/20 196 lb 10.4 oz (89.2 kg)  12/29/19 196 lb 9.6 oz (89.2 kg)     GEN:  Well  nourished, well developed in no acute distress HEENT: Normal NECK: No JVD; No carotid bruits LYMPHATICS: No lymphadenopathy CARDIAC: RRR, no murmurs, rubs, gallops RESPIRATORY:  Clear to auscultation without rales, wheezing or rhonchi  ABDOMEN: Soft, non-tender, non-distended MUSCULOSKELETAL:  No edema; No deformity  SKIN: Warm and dry NEUROLOGIC:  Alert and oriented x 3 PSYCHIATRIC:  Normal affect    Signed, Shirlee More, MD  03/02/2020 11:19 AM    [place]

## 2020-03-02 ENCOUNTER — Other Ambulatory Visit: Payer: Self-pay

## 2020-03-02 ENCOUNTER — Encounter: Payer: Self-pay | Admitting: Cardiology

## 2020-03-02 ENCOUNTER — Ambulatory Visit: Payer: PPO | Admitting: Cardiology

## 2020-03-02 VITALS — BP 168/88 | HR 68 | Ht 72.0 in | Wt 201.8 lb

## 2020-03-02 DIAGNOSIS — I110 Hypertensive heart disease with heart failure: Secondary | ICD-10-CM | POA: Diagnosis not present

## 2020-03-02 DIAGNOSIS — I480 Paroxysmal atrial fibrillation: Secondary | ICD-10-CM | POA: Diagnosis not present

## 2020-03-02 DIAGNOSIS — Z79899 Other long term (current) drug therapy: Secondary | ICD-10-CM

## 2020-03-02 DIAGNOSIS — Z7901 Long term (current) use of anticoagulants: Secondary | ICD-10-CM

## 2020-03-02 HISTORY — DX: Hypertensive heart disease with heart failure: I11.0

## 2020-03-02 MED ORDER — ELIQUIS 5 MG PO TABS: 5.0000 mg | ORAL_TABLET | Freq: Two times a day (BID) | ORAL | 3 refills | Status: DC

## 2020-03-02 MED ORDER — FLECAINIDE ACETATE 50 MG PO TABS: 50.0000 mg | ORAL_TABLET | Freq: Two times a day (BID) | ORAL | 3 refills | Status: DC

## 2020-03-02 MED ORDER — AMLODIPINE BESYLATE 5 MG PO TABS: 5.0000 mg | ORAL_TABLET | Freq: Every day | ORAL | 3 refills | Status: DC

## 2020-03-02 NOTE — Patient Instructions (Signed)
Medication Instructions:  Your physician has recommended you make the following change in your medication:  START: Amlodipine 5 mg take one tablet by mouth daily.  *If you need a refill on your cardiac medications before your next appointment, please call your pharmacy*   Lab Work: Your physician recommends that you return for lab work in: 2 weeks BMP, ProBNP If you have labs (blood work) drawn today and your tests are completely normal, you will receive your results only by: Marland Kitchen MyChart Message (if you have MyChart) OR . A paper copy in the mail If you have any lab test that is abnormal or we need to change your treatment, we will call you to review the results.   Testing/Procedures: None   Follow-Up: At [facility], you and your health needs are our priority.  As part of our continuing mission to provide you with exceptional heart care, we have created designated Provider Care Teams.  These Care Teams include your primary Cardiologist (physician) and Advanced Practice Providers (APPs -  Physician Assistants and Nurse Practitioners) who all work together to provide you with the care you need, when you need it.  We recommend signing up for the patient portal called "MyChart".  Sign up information is provided on this After Visit Summary.  MyChart is used to connect with patients for Virtual Visits (Telemedicine).  Patients are able to view lab/test results, encounter notes, upcoming appointments, etc.  Non-urgent messages can be sent to your provider as well.   To learn more about what you can do with MyChart, go to [email or web address].    Your next appointment:   6 week(s)  The format for your next appointment:   In Person  Provider:   Shirlee More, MD   Other Instructions

## 2020-03-16 ENCOUNTER — Other Ambulatory Visit: Payer: Self-pay | Admitting: Cardiology

## 2020-03-16 DIAGNOSIS — Z79899 Other long term (current) drug therapy: Secondary | ICD-10-CM | POA: Diagnosis not present

## 2020-03-16 DIAGNOSIS — I10 Essential (primary) hypertension: Secondary | ICD-10-CM | POA: Diagnosis not present

## 2020-03-16 DIAGNOSIS — Z7901 Long term (current) use of anticoagulants: Secondary | ICD-10-CM | POA: Diagnosis not present

## 2020-03-16 DIAGNOSIS — I480 Paroxysmal atrial fibrillation: Secondary | ICD-10-CM | POA: Diagnosis not present

## 2020-03-17 LAB — BASIC METABOLIC PANEL
BUN/Creatinine Ratio: 11 (ref 10–24)
BUN: 10 mg/dL (ref 8–27)
CO2: 20 mmol/L (ref 20–29)
Calcium: 9.9 mg/dL (ref 8.6–10.2)
Chloride: 104 mmol/L (ref 96–106)
Creatinine, Ser: 0.87 mg/dL (ref 0.76–1.27)
Glucose: 134 mg/dL — ABNORMAL HIGH (ref 65–99)
Potassium: 4.1 mmol/L (ref 3.5–5.2)
Sodium: 139 mmol/L (ref 134–144)
eGFR: 94 mL/min/{1.73_m2} (ref >59)

## 2020-03-17 LAB — PRO B NATRIURETIC PEPTIDE: NT-Pro BNP: 127 pg/mL (ref 0–376)

## 2020-03-24 DIAGNOSIS — C44612 Basal cell carcinoma of skin of right upper limb, including shoulder: Secondary | ICD-10-CM | POA: Diagnosis not present

## 2020-03-24 DIAGNOSIS — D485 Neoplasm of uncertain behavior of skin: Secondary | ICD-10-CM | POA: Diagnosis not present

## 2020-03-24 DIAGNOSIS — L570 Actinic keratosis: Secondary | ICD-10-CM | POA: Diagnosis not present

## 2020-04-08 DIAGNOSIS — C44612 Basal cell carcinoma of skin of right upper limb, including shoulder: Secondary | ICD-10-CM | POA: Diagnosis not present

## 2020-04-13 DIAGNOSIS — M4316 Spondylolisthesis, lumbar region: Secondary | ICD-10-CM | POA: Diagnosis not present

## 2020-04-19 DIAGNOSIS — Z79899 Other long term (current) drug therapy: Secondary | ICD-10-CM | POA: Diagnosis not present

## 2020-04-19 DIAGNOSIS — Z1331 Encounter for screening for depression: Secondary | ICD-10-CM | POA: Diagnosis not present

## 2020-04-19 DIAGNOSIS — Z6827 Body mass index (BMI) 27.0-27.9, adult: Secondary | ICD-10-CM | POA: Diagnosis not present

## 2020-04-19 DIAGNOSIS — Z0000 Encounter for general adult medical examination without abnormal findings: Secondary | ICD-10-CM | POA: Diagnosis not present

## 2020-04-19 DIAGNOSIS — E7800 Pure hypercholesterolemia, unspecified: Secondary | ICD-10-CM | POA: Diagnosis not present

## 2020-04-19 DIAGNOSIS — I10 Essential (primary) hypertension: Secondary | ICD-10-CM | POA: Diagnosis not present

## 2020-04-19 DIAGNOSIS — Z125 Encounter for screening for malignant neoplasm of prostate: Secondary | ICD-10-CM | POA: Diagnosis not present

## 2020-04-20 NOTE — Progress Notes (Signed)
Cardiology Office Note:    Date:  04/21/2020   ID:  Benjamin Griffin, DOB December 28, 1950, MRN [id number]  PCP:  Ronita Hipps, MD  Cardiologist:  Shirlee More, MD    Referring MD: Ronita Hipps, MD    ASSESSMENT:    1. Hypertensive heart disease with heart failure (HCC)   2. Paroxysmal atrial fibrillation ([place])   3. High risk medication use   4. Chronic anticoagulation    PLAN:    In order of problems listed above:  1. BP is much improved chronic headache is resolved obviously was due to hypertension before we will continue his current regimen with calcium channel blocker beta-blocker and home monitoring 2. In sinus rhythm continue flecainide no signs of toxicity along with his anticoagulant.   Next appointment: 6 months   Medication Adjustments/Labs and Tests Ordered: Current medicines are reviewed at length with the patient today.  Concerns regarding medicines are outlined above.  Orders Placed This Encounter  Procedures  . EKG 12-Lead   No orders of the defined types were placed in this encounter.   Chief Complaint  Patient presents with  . Follow-up  . Hypertension    History of Present Illness:    Benjamin Griffin is a 70 y.o. male with a hx of paroxysmal atrial fibrillation maintaining sinus rhythm on flecainide chronically anticoagulated and hypertensive heart disease with heart failure.  He was last seen 03/02/2020 with poorly controlled hypertension BP 168/88 and was initiated on an ARB optimize blood pressure control. Compliance with diet, lifestyle and medications: Yes  He is quite improved Home blood pressures run 1 20-1 30 and his chronic headache is resolved. No palpitation rapid heart rate bleeding from his anticoagulant chest pain palpitation or syncope. He is having surgery Mohs procedure for skin cancer tomorrow dermatology office and asked him to hold his anticoagulant tonight tomorrow morning and tomorrow evening. Past Medical History:  Diagnosis Date   . Arthritis   . Cancer ([place])    skin cancer, removed from bilateral arms  . History of kidney stones   . Hyperlipidemia   . Hypertension   . Palpitations   . Sleep apnea    CPAP  . Stroke ([place])    approx 2000, on plavix. When found on CT, was told it was an "old stroke"    Past Surgical History:  Procedure Laterality Date  . APPENDECTOMY    . CARDIAC CATHETERIZATION     approx year 2000 @ [facility] after stroke  . CATARACT EXTRACTION, BILATERAL    . EYE SURGERY    . HERNIA REPAIR    . LOOP RECORDER INSERTION     approx 2000   . LOOP RECORDER REMOVAL     1 week after instertion, approx 2000  . NASAL SINUS SURGERY    . TRANSFORAMINAL LUMBAR INTERBODY FUSION W/ MIS 1 LEVEL N/A 01/01/2020   Procedure: TRANSFORAMINAL LUMBAR INTERBODY FUSION (TLIF) L4-L5;  Surgeon: Vallarie Mare, MD;  Location: [place];  Service: Neurosurgery;  Laterality: N/A;  TRANSFORAMINAL LUMBAR INTERBODY FUSION (TLIF) L4-L5   . UMBILICAL HERNIA REPAIR      Current Medications: Current Meds  Medication Sig  . acetaminophen (TYLENOL) 650 MG CR tablet Take 650 mg by mouth every 4 (four) hours as needed for pain.  Marland Kitchen amLODipine (NORVASC) 5 MG tablet Take 1 tablet (5 mg total) by mouth daily.  . calcium carbonate (TUMS - DOSED IN MG ELEMENTAL CALCIUM) 500 MG chewable tablet Chew 1,000 mg by mouth  daily as needed for indigestion or heartburn.  . Choline Fenofibrate (FENOFIBRIC ACID) 135 MG CPDR Take 135 mg by mouth daily.  . cyclobenzaprine (FLEXERIL) 10 MG tablet Take 1 tablet (10 mg total) by mouth 3 (three) times daily as needed for muscle spasms.  Marland Kitchen docusate sodium (COLACE) 100 MG capsule Take 1 capsule (100 mg total) by mouth 2 (two) times daily.  Marland Kitchen ELIQUIS 5 MG TABS tablet Take 1 tablet (5 mg total) by mouth 2 (two) times daily.  . fexofenadine (ALLEGRA) 180 MG tablet Take 180 mg by mouth daily as needed for allergies or rhinitis.  . flecainide (TAMBOCOR) 50 MG tablet Take 1 tablet (50 mg total) by  mouth 2 (two) times daily.  . Menthol, Topical Analgesic, (BIOFREEZE ROLL-ON EX) Apply 1 application topically daily as needed (leg pain).  . metoprolol succinate (TOPROL-XL) 50 MG 24 hr tablet Take 50 mg by mouth daily.  Marland Kitchen oxyCODONE-acetaminophen (PERCOCET/ROXICET) 5-325 MG tablet Take 1-2 tablets by mouth every 6 (six) hours as needed for moderate pain or severe pain.  Vladimir Faster Glycol-Propyl Glycol (SYSTANE OP) Place 1 drop into both eyes daily as needed (dry eyes).  . simvastatin (ZOCOR) 20 MG tablet Take 20 mg by mouth daily.  . sodium chloride (OCEAN) 0.65 % SOLN nasal spray Place 1-2 sprays into both nostrils as needed for congestion.  . traMADol (ULTRAM) 50 MG tablet Take 50 mg by mouth every 6 (six) hours as needed.     Allergies:   Meperidine and Vicodin [hydrocodone-acetaminophen]   Social History   Socioeconomic History  . Marital status: Married    Spouse name: Not on file  . Number of children: Not on file  . Years of education: Not on file  . Highest education level: Not on file  Occupational History  . Not on file  Tobacco Use  . Smoking status: Former Smoker    Packs/day: 0.50    Years: 25.00    Pack years: 12.50    Types: Cigarettes    Quit date: 12/17/1998    Years since quitting: 21.3  . Smokeless tobacco: Never Used  . Tobacco comment: quit in 2000  Vaping Use  . Vaping Use: Never used  Substance and Sexual Activity  . Alcohol use: Not Currently    Comment: 1 beer occasionally  . Drug use: Never  . Sexual activity: Not on file  Other Topics Concern  . Not on file  Social History Narrative   ** Merged History Encounter **       Social Determinants of Health   Financial Resource Strain: Not on file  Food Insecurity: Not on file  Transportation Needs: Not on file  Physical Activity: Not on file  Stress: Not on file  Social Connections: Not on file     Family History: The patient's family history includes Atrial fibrillation in his mother;  Hypertension in his brother, brother, brother, brother, sister, sister, and sister; Liver cancer in his father; Lymphoma in his mother. ROS:   Please see the history of present illness.    All other systems reviewed and are negative.  EKGs/Labs/Other Studies Reviewed:    The following studies were reviewed today:  EKG:  EKG ordered today and personally reviewed.  The ekg ordered today demonstrates sinus rhythm normal EKG no evidence of 1C antiarrhythmic drug toxicity  Recent Labs: 12/29/2019: Platelets 152 01/01/2020: Hemoglobin 11.9 03/16/2020: BUN 10; Creatinine, Ser 0.87; NT-Pro BNP 127; Potassium 4.1; Sodium 139  Recent Lipid Panel No results  found for: CHOL, TRIG, HDL, CHOLHDL, VLDL, LDLCALC, LDLDIRECT  Physical Exam:    VS:  BP 116/68 (BP Location: Left Arm, Patient Position: Sitting)   Pulse 68   Ht 6' (1.829 m)   Wt 201 lb 6.4 oz (91.4 kg)   SpO2 96%   BMI 27.31 kg/m     Wt Readings from Last 3 Encounters:  04/21/20 201 lb 6.4 oz (91.4 kg)  03/02/20 201 lb 12.8 oz (91.5 kg)  01/01/20 196 lb 10.4 oz (89.2 kg)     GEN: Appears healthy and is age well nourished, well developed in no acute distress HEENT: Normal NECK: No JVD; No carotid bruits LYMPHATICS: No lymphadenopathy CARDIAC: RRR, no murmurs, rubs, gallops RESPIRATORY:  Clear to auscultation without rales, wheezing or rhonchi  ABDOMEN: Soft, non-tender, non-distended MUSCULOSKELETAL:  No edema; No deformity  SKIN: Warm and dry NEUROLOGIC:  Alert and oriented x 3 PSYCHIATRIC:  Normal affect    Signed, Shirlee More, MD  04/21/2020 2:21 PM    [facility]

## 2020-04-21 ENCOUNTER — Ambulatory Visit: Payer: PPO | Admitting: Cardiology

## 2020-04-21 ENCOUNTER — Other Ambulatory Visit: Payer: Self-pay

## 2020-04-21 ENCOUNTER — Encounter: Payer: Self-pay | Admitting: Cardiology

## 2020-04-21 VITALS — BP 116/68 | HR 68 | Ht 72.0 in | Wt 201.4 lb

## 2020-04-21 DIAGNOSIS — I4891 Unspecified atrial fibrillation: Secondary | ICD-10-CM

## 2020-04-21 DIAGNOSIS — E785 Hyperlipidemia, unspecified: Secondary | ICD-10-CM | POA: Insufficient documentation

## 2020-04-21 DIAGNOSIS — Z79899 Other long term (current) drug therapy: Secondary | ICD-10-CM

## 2020-04-21 DIAGNOSIS — M51369 Other intervertebral disc degeneration, lumbar region without mention of lumbar back pain or lower extremity pain: Secondary | ICD-10-CM

## 2020-04-21 DIAGNOSIS — M5136 Other intervertebral disc degeneration, lumbar region: Secondary | ICD-10-CM | POA: Insufficient documentation

## 2020-04-21 DIAGNOSIS — I110 Hypertensive heart disease with heart failure: Secondary | ICD-10-CM | POA: Diagnosis not present

## 2020-04-21 DIAGNOSIS — I480 Paroxysmal atrial fibrillation: Secondary | ICD-10-CM

## 2020-04-21 DIAGNOSIS — Z7901 Long term (current) use of anticoagulants: Secondary | ICD-10-CM | POA: Diagnosis not present

## 2020-04-21 HISTORY — DX: Unspecified atrial fibrillation: I48.91

## 2020-04-21 HISTORY — DX: Hyperlipidemia, unspecified: E78.5

## 2020-04-21 HISTORY — DX: Paroxysmal atrial fibrillation: I48.0

## 2020-04-21 HISTORY — DX: Other intervertebral disc degeneration, lumbar region without mention of lumbar back pain or lower extremity pain: M51.369

## 2020-04-21 NOTE — Patient Instructions (Signed)

## 2020-04-22 DIAGNOSIS — C44212 Basal cell carcinoma of skin of right ear and external auricular canal: Secondary | ICD-10-CM | POA: Diagnosis not present

## 2020-04-26 DIAGNOSIS — Z1211 Encounter for screening for malignant neoplasm of colon: Secondary | ICD-10-CM | POA: Diagnosis not present

## 2020-04-26 DIAGNOSIS — Z1212 Encounter for screening for malignant neoplasm of rectum: Secondary | ICD-10-CM | POA: Diagnosis not present

## 2020-05-03 DIAGNOSIS — G4730 Sleep apnea, unspecified: Secondary | ICD-10-CM | POA: Diagnosis not present

## 2020-05-04 ENCOUNTER — Encounter: Payer: Self-pay | Admitting: Pulmonary Disease

## 2020-05-05 DIAGNOSIS — C44519 Basal cell carcinoma of skin of other part of trunk: Secondary | ICD-10-CM | POA: Diagnosis not present

## 2020-05-05 LAB — COLOGUARD: COLOGUARD: NEGATIVE

## 2020-05-26 ENCOUNTER — Ambulatory Visit (INDEPENDENT_AMBULATORY_CARE_PROVIDER_SITE_OTHER): Payer: PPO | Admitting: Pulmonary Disease

## 2020-05-26 ENCOUNTER — Other Ambulatory Visit: Payer: Self-pay

## 2020-05-26 ENCOUNTER — Encounter: Payer: Self-pay | Admitting: Pulmonary Disease

## 2020-05-26 VITALS — BP 132/80 | HR 62 | Temp 98.5°F | Ht 72.0 in | Wt 195.6 lb

## 2020-05-26 DIAGNOSIS — G4733 Obstructive sleep apnea (adult) (pediatric): Secondary | ICD-10-CM | POA: Diagnosis not present

## 2020-05-26 DIAGNOSIS — Z9989 Dependence on other enabling machines and devices: Secondary | ICD-10-CM | POA: Diagnosis not present

## 2020-05-26 NOTE — Patient Instructions (Signed)
Scheduled for home sleep study  Continue using current device  I will tentatively see you back in about 3 to 4 months  Sleep Apnea Sleep apnea affects breathing during sleep. It causes breathing to stop for a short time or to become shallow. It can also increase the risk of:  Heart attack.  Stroke.  Being very overweight (obese).  Diabetes.  Heart failure.  Irregular heartbeat. The goal of treatment is to help you breathe normally again. What are the causes? There are three kinds of sleep apnea:  Obstructive sleep apnea. This is caused by a blocked or collapsed airway.  Central sleep apnea. This happens when the brain does not send the right signals to the muscles that control breathing.  Mixed sleep apnea. This is a combination of obstructive and central sleep apnea. The most common cause of this condition is a collapsed or blocked airway. This can happen if:  Your throat muscles are too relaxed.  Your tongue and tonsils are too large.  You are overweight.  Your airway is too small.   What increases the risk?  Being overweight.  Smoking.  Having a small airway.  Being older.  Being male.  Drinking alcohol.  Taking medicines to calm yourself (sedatives or tranquilizers).  Having family members with the condition. What are the signs or symptoms?  Trouble staying asleep.  Being sleepy or tired during the day.  Getting angry a lot.  Loud snoring.  Headaches in the morning.  Not being able to focus your mind (concentrate).  Forgetting things.  Less interest in sex.  Mood swings.  Personality changes.  Feelings of sadness (depression).  Waking up a lot during the night to pee (urinate).  Dry mouth.  Sore throat. How is this diagnosed?  Your medical history.  A physical exam.  A test that is done when you are sleeping (sleep study). The test is most often done in a sleep lab but may also be done at home. How is this  treated?  Sleeping on your side.  Using a medicine to get rid of mucus in your nose (decongestant).  Avoiding the use of alcohol, medicines to help you relax, or certain pain medicines (narcotics).  Losing weight, if needed.  Changing your diet.  Not smoking.  Using a machine to open your airway while you sleep, such as: ? An oral appliance. This is a mouthpiece that shifts your lower jaw forward. ? A CPAP device. This device blows air through a mask when you breathe out (exhale). ? An EPAP device. This has valves that you put in each nostril. ? A BPAP device. This device blows air through a mask when you breathe in (inhale) and breathe out.  Having surgery if other treatments do not work. It is important to get treatment for sleep apnea. Without treatment, it can lead to:  High blood pressure.  Coronary artery disease.  In men, not being able to have an erection (impotence).  Reduced thinking ability.   Follow these instructions at home: Lifestyle  Make changes that your doctor recommends.  Eat a healthy diet.  Lose weight if needed.  Avoid alcohol, medicines to help you relax, and some pain medicines.  Do not use any products that contain nicotine or tobacco, such as cigarettes, e-cigarettes, and chewing tobacco. If you need help quitting, ask your doctor. General instructions  Take over-the-counter and prescription medicines only as told by your doctor.  If you were given a machine to use while  you sleep, use it only as told by your doctor.  If you are having surgery, make sure to tell your doctor you have sleep apnea. You may need to bring your device with you.  Keep all follow-up visits as told by your doctor. This is important. Contact a doctor if:  The machine that you were given to use during sleep bothers you or does not seem to be working.  You do not get better.  You get worse. Get help right away if:  Your chest hurts.  You have trouble  breathing in enough air.  You have an uncomfortable feeling in your back, arms, or stomach.  You have trouble talking.  One side of your body feels weak.  A part of your face is hanging down. These symptoms may be an emergency. Do not wait to see if the symptoms will go away. Get medical help right away. Call your local emergency services (911 in the [place] Do not drive yourself to the hospital. Summary  This condition affects breathing during sleep.  The most common cause is a collapsed or blocked airway.  The goal of treatment is to help you breathe normally while you sleep. This information is not intended to replace advice given to you by your health care provider. Make sure you discuss any questions you have with your health care provider. Document Revised: 10/19/2017 Document Reviewed: 08/28/2017 Elsevier Patient Education  2021 [place].    Call with significant concerns

## 2020-05-26 NOTE — Progress Notes (Signed)
Benjamin Griffin    [id number]    1950/03/15  Primary Care Physician:Holt, Gara Kroner, MD  Referring Physician: Ronita Hipps, MD [place] [id number],   Chief complaint:   Patient being seen for obstructive sleep apnea Has been having left facial swelling  HPI:  Has had left facial swelling a few times recently Initially treated with a course of antibiotics-resolved completely Did have a recurrence which was treated with another course of antibiotics and resolved completely  He had back surgery about December and did have recurrence of his facial swelling, he was on antibiotics at that time, but that course of antibiotics did not help his swelling  At some point saw an ENT doctor who mentioned that it may be related to his CPAP  His CPAP is about 69 years old It is the only machine he has had Uses nasal pillows-most effective uncomfortable for him  He still has significant snoring Significant apneic events if he does not use his CPAP  Has a history of coronary artery disease, history of stroke, atrial fibrillation  Feels well rested following CPAP use  Reformed smoker No pertinent occupational history   Outpatient Encounter Medications as of 05/26/2020  Medication Sig  . acetaminophen (TYLENOL) 650 MG CR tablet Take 650 mg by mouth every 4 (four) hours as needed for pain.  Marland Kitchen amLODipine (NORVASC) 5 MG tablet Take 1 tablet (5 mg total) by mouth daily.  . calcium carbonate (TUMS - DOSED IN MG ELEMENTAL CALCIUM) 500 MG chewable tablet Chew 1,000 mg by mouth daily as needed for indigestion or heartburn.  . Choline Fenofibrate (FENOFIBRIC ACID) 135 MG CPDR Take 135 mg by mouth daily.  Marland Kitchen ELIQUIS 5 MG TABS tablet Take 1 tablet (5 mg total) by mouth 2 (two) times daily.  . fexofenadine (ALLEGRA) 180 MG tablet Take 180 mg by mouth daily as needed for allergies or rhinitis.  . Menthol, Topical Analgesic, (BIOFREEZE ROLL-ON EX) Apply 1 application  topically daily as needed (leg pain).  . metoprolol succinate (TOPROL-XL) 50 MG 24 hr tablet Take 50 mg by mouth daily.  Vladimir Faster Glycol-Propyl Glycol (SYSTANE OP) Place 1 drop into both eyes daily as needed (dry eyes).  . simvastatin (ZOCOR) 20 MG tablet Take 20 mg by mouth daily.  . sodium chloride (OCEAN) 0.65 % SOLN nasal spray Place 1-2 sprays into both nostrils as needed for congestion.  . cyclobenzaprine (FLEXERIL) 10 MG tablet Take 1 tablet (10 mg total) by mouth 3 (three) times daily as needed for muscle spasms. (Patient not taking: Reported on 05/26/2020)  . docusate sodium (COLACE) 100 MG capsule Take 1 capsule (100 mg total) by mouth 2 (two) times daily. (Patient not taking: Reported on 05/26/2020)  . flecainide (TAMBOCOR) 50 MG tablet Take 1 tablet (50 mg total) by mouth 2 (two) times daily. (Patient not taking: Reported on 05/26/2020)  . oxyCODONE-acetaminophen (PERCOCET/ROXICET) 5-325 MG tablet Take 1-2 tablets by mouth every 6 (six) hours as needed for moderate pain or severe pain. (Patient not taking: Reported on 05/26/2020)  . traMADol (ULTRAM) 50 MG tablet Take 50 mg by mouth every 6 (six) hours as needed. (Patient not taking: Reported on 05/26/2020)   No facility-administered encounter medications on file as of 05/26/2020.    Allergies as of 05/26/2020 - Review Complete 05/26/2020  Allergen Reaction Noted  . Meperidine Other (See Comments) 06/03/2019  . Vicodin [hydrocodone-acetaminophen] Nausea And Vomiting 12/24/2019    Past  Medical History:  Diagnosis Date  . Arthritis   . Cancer ([place])    skin cancer, removed from bilateral arms  . History of kidney stones   . Hyperlipidemia   . Hypertension   . Palpitations   . Sleep apnea    CPAP  . Stroke ([place])    approx 2000, on plavix. When found on CT, was told it was an "old stroke"    Past Surgical History:  Procedure Laterality Date  . APPENDECTOMY    . CARDIAC CATHETERIZATION     approx year 2000 @ [facility] after  stroke  . CATARACT EXTRACTION, BILATERAL    . EYE SURGERY    . HERNIA REPAIR    . LOOP RECORDER INSERTION     approx 2000   . LOOP RECORDER REMOVAL     1 week after instertion, approx 2000  . NASAL SINUS SURGERY    . TRANSFORAMINAL LUMBAR INTERBODY FUSION W/ MIS 1 LEVEL N/A 01/01/2020   Procedure: TRANSFORAMINAL LUMBAR INTERBODY FUSION (TLIF) L4-L5;  Surgeon: Vallarie Mare, MD;  Location: [facility];  Service: Neurosurgery;  Laterality: N/A;  TRANSFORAMINAL LUMBAR INTERBODY FUSION (TLIF) L4-L5   . UMBILICAL HERNIA REPAIR      Family History  Problem Relation Age of Onset  . Lymphoma Mother   . Atrial fibrillation Mother   . Liver cancer Father   . Hypertension Sister   . Hypertension Brother   . Hypertension Brother   . Hypertension Brother   . Hypertension Brother   . Hypertension Sister   . Hypertension Sister     Social History   Socioeconomic History  . Marital status: Married    Spouse name: Not on file  . Number of children: Not on file  . Years of education: Not on file  . Highest education level: Not on file  Occupational History  . Not on file  Tobacco Use  . Smoking status: Former Smoker    Packs/day: 0.50    Years: 25.00    Pack years: 12.50    Types: Cigarettes    Quit date: 12/17/1998    Years since quitting: 21.4  . Smokeless tobacco: Never Used  . Tobacco comment: quit in 2000  Vaping Use  . Vaping Use: Never used  Substance and Sexual Activity  . Alcohol use: Not Currently    Comment: 1 beer occasionally  . Drug use: Never  . Sexual activity: Not on file  Other Topics Concern  . Not on file  Social History Narrative   ** Merged History Encounter **       Social Determinants of Health   Financial Resource Strain: Not on file  Food Insecurity: Not on file  Transportation Needs: Not on file  Physical Activity: Not on file  Stress: Not on file  Social Connections: Not on file  Intimate Partner Violence: Not on file    Review of  Systems  Constitutional: Negative for fatigue.  Respiratory: Positive for apnea. Negative for shortness of breath.   Psychiatric/Behavioral: Positive for sleep disturbance.    Vitals:   05/26/20 1344  BP: 132/80  Pulse: 62  Temp: 98.5 F (36.9 C)  SpO2: 96%     Physical Exam Constitutional:      Appearance: He is obese.  HENT:     Head: Normocephalic and atraumatic.     Nose: Nose normal.     Mouth/Throat:     Mouth: Mucous membranes are moist.     Comments: Mallampati 3,  crowded oropharynx Eyes:     General:        Right eye: No discharge.        Left eye: No discharge.     Pupils: Pupils are equal, round, and reactive to light.  Cardiovascular:     Rate and Rhythm: Normal rate and regular rhythm.     Heart sounds: No murmur heard. No friction rub.  Pulmonary:     Effort: No respiratory distress.     Breath sounds: No stridor. No wheezing or rhonchi.  Musculoskeletal:     Cervical back: No tenderness.  Neurological:     Mental Status: He is alert.  Psychiatric:        Mood and Affect: Mood normal.    Data Reviewed: Sleep study results not available at present  We do not have a smart card to get a download from his machine  Assessment:  History of obstructive sleep apnea -Compliant with CPAP use  Facial swelling -Presumed related to CPAP use -Not sure this is the case -CPAP may be associated occasionally with periorbital swelling because of obstruction so the drainage of the venous system a lymphatic system  History of hypertension Atrial fibrillation History of CVA  Plan/Recommendations: Schedule patient for home sleep study  We will upgrade his machine as soon as able  Continue using current CPAP  Encouraged to call with any significant concerns  It is important to stay on CPAP especially with his significant comorbidities   Sherrilyn Rist MD [facility] 05/26/2020, 2:08 PM  CC: Ronita Hipps, MD

## 2020-06-17 ENCOUNTER — Other Ambulatory Visit: Payer: Self-pay

## 2020-06-17 DIAGNOSIS — G4733 Obstructive sleep apnea (adult) (pediatric): Secondary | ICD-10-CM

## 2020-06-17 DIAGNOSIS — Z9989 Dependence on other enabling machines and devices: Secondary | ICD-10-CM

## 2020-06-23 ENCOUNTER — Telehealth: Payer: Self-pay | Admitting: Pulmonary Disease

## 2020-06-23 DIAGNOSIS — G4730 Sleep apnea, unspecified: Secondary | ICD-10-CM

## 2020-06-23 DIAGNOSIS — G4733 Obstructive sleep apnea (adult) (pediatric): Secondary | ICD-10-CM | POA: Diagnosis not present

## 2020-06-23 NOTE — Telephone Encounter (Signed)
Call patient  Sleep study result  Date of study: 06/17/2020  Impression: Mild obstructive sleep apnea Mild oxygen desaturations  Recommendation:  DME referral for CPAP therapy  Continue CPAP therapy Auto titrating CPAP with pressure settings of 5-15 will be appropriate  Follow-up as previously discussed

## 2020-06-24 NOTE — Telephone Encounter (Signed)
I called and spoke with patient regarding results and Dr. Doreene Eland. Patient verbalized understanding and I sent in order to [facility] for new CPAP machine as his machine is 69 years old. Patient verbalized understanding, nothing further needed.

## 2020-07-13 DIAGNOSIS — M4316 Spondylolisthesis, lumbar region: Secondary | ICD-10-CM | POA: Diagnosis not present

## 2020-08-23 ENCOUNTER — Telehealth: Payer: Self-pay | Admitting: Cardiology

## 2020-08-23 NOTE — Telephone Encounter (Signed)
Patient came by office and said the price for his Eliquis has gone up and would like to know if he can change the meds or give samples and/or coupons to help with the cost.  Please advise

## 2020-08-23 NOTE — Telephone Encounter (Signed)
Spoke to the patient just now and let him know that he could call [facility] directly to ask them for help with this. The number is 1-855-ELIQUIS. I told him to make sure that he explains that he has been taking this Eliquis but now can not afford it due to being in the donut hole. I also advised for him to call back if he has any issues or concerns.    Encouraged patient to call back with any questions or concerns.

## 2020-08-25 ENCOUNTER — Encounter: Payer: Self-pay | Admitting: Cardiology

## 2020-08-25 ENCOUNTER — Ambulatory Visit: Payer: Self-pay

## 2020-08-25 ENCOUNTER — Other Ambulatory Visit: Payer: Self-pay

## 2020-08-25 ENCOUNTER — Ambulatory Visit: Payer: PPO | Admitting: Cardiology

## 2020-08-25 VITALS — BP 144/75 | HR 61 | Ht 72.0 in | Wt 195.6 lb

## 2020-08-25 DIAGNOSIS — Z79899 Other long term (current) drug therapy: Secondary | ICD-10-CM

## 2020-08-25 DIAGNOSIS — E782 Mixed hyperlipidemia: Secondary | ICD-10-CM

## 2020-08-25 DIAGNOSIS — Z7901 Long term (current) use of anticoagulants: Secondary | ICD-10-CM | POA: Diagnosis not present

## 2020-08-25 DIAGNOSIS — I480 Paroxysmal atrial fibrillation: Secondary | ICD-10-CM | POA: Diagnosis not present

## 2020-08-25 DIAGNOSIS — I110 Hypertensive heart disease with heart failure: Secondary | ICD-10-CM

## 2020-08-25 NOTE — Progress Notes (Signed)
Cardiology Office Note:    Date:  08/25/2020   ID:  Benjamin Griffin, DOB 03/10/1950, MRN [id number]  PCP:  Ronita Hipps, MD  Cardiologist:  Shirlee More, MD    Referring MD: Ronita Hipps, MD    ASSESSMENT:    1. Paroxysmal atrial fibrillation (HCC)   2. High risk medication use   3. Chronic anticoagulation   4. Hypertensive heart disease with heart failure ([place])    PLAN:    In order of problems listed above:  Vencil continues to do well maintaining sinus rhythm tolerates flecainide and his anticoagulant without bleeding complication.  Vigorous active man and has no cardiovascular symptoms. Stable no evidence of 1C antiarrhythmic drug toxicity continue low-dose flecainide Continues anticoagulant has had no bleeding complication Stable continue calcium channel blocker beta-blocker for BP control Continue statin with hyperlipidemia   Next appointment: Takes very low antiarrhythmic drug dose Can be set up to see him back in the office 1 year   Medication Adjustments/Labs and Tests Ordered: Current medicines are reviewed at length with the patient today.  Concerns regarding medicines are outlined above.  No orders of the defined types were placed in this encounter.  No orders of the defined types were placed in this encounter.   Chief Complaint  Patient presents with   Follow-up   Atrial Fibrillation    History of Present Illness:    Benjamin Griffin is a 70 y.o. male with a hx of paroxysmal atrial fibrillation maintaining sinus rhythm on flecainide with chronic anticoagulation and hypertensive heart disease with last seen 04/21/2020.  Compliance with diet, lifestyle and medications: Yes  He tolerates his medications he is doing well no bleeding and is anticoagulated no palpitation rapid heart rhythm chest pain edema shortness of breath.  He is pleased with the progress  Past Medical History:  Diagnosis Date   Arthritis    Cancer ([place])    skin cancer, removed from  bilateral arms   History of kidney stones    Hyperlipidemia    Hypertension    Palpitations    Sleep apnea    CPAP   Stroke ([place])    approx 2000, on plavix. When found on CT, was told it was an "old stroke"    Past Surgical History:  Procedure Laterality Date   APPENDECTOMY     CARDIAC CATHETERIZATION     approx year 2000 @ [facility] after stroke   CATARACT EXTRACTION, BILATERAL     EYE SURGERY     HERNIA REPAIR     LOOP RECORDER INSERTION     approx 2000    LOOP RECORDER REMOVAL     1 week after instertion, approx 2000   NASAL SINUS SURGERY     TRANSFORAMINAL LUMBAR INTERBODY FUSION W/ MIS 1 LEVEL N/A 01/01/2020   Procedure: TRANSFORAMINAL LUMBAR INTERBODY FUSION (TLIF) L4-L5;  Surgeon: Vallarie Mare, MD;  Location: [facility];  Service: Neurosurgery;  Laterality: N/A;  TRANSFORAMINAL LUMBAR INTERBODY FUSION (TLIF) [id number]    UMBILICAL HERNIA REPAIR      Current Medications: Current Meds  Medication Sig   acetaminophen (TYLENOL) 650 MG CR tablet Take 650 mg by mouth every 4 (four) hours as needed for pain.   amLODipine (NORVASC) 5 MG tablet Take 1 tablet (5 mg total) by mouth daily.   calcium carbonate (TUMS - DOSED IN MG ELEMENTAL CALCIUM) 500 MG chewable tablet Chew 1,000 mg by mouth daily as needed for indigestion or heartburn.   Choline Fenofibrate (FENOFIBRIC  ACID) 135 MG CPDR Take 135 mg by mouth daily.   cyclobenzaprine (FLEXERIL) 10 MG tablet Take 1 tablet (10 mg total) by mouth 3 (three) times daily as needed for muscle spasms.   docusate sodium (COLACE) 100 MG capsule Take 1 capsule (100 mg total) by mouth 2 (two) times daily.   ELIQUIS 5 MG TABS tablet Take 1 tablet (5 mg total) by mouth 2 (two) times daily.   fexofenadine (ALLEGRA) 180 MG tablet Take 180 mg by mouth daily as needed for allergies or rhinitis.   flecainide (TAMBOCOR) 50 MG tablet Take 1 tablet (50 mg total) by mouth 2 (two) times daily.   Menthol, Topical Analgesic, (BIOFREEZE ROLL-ON EX) Apply 1  application topically daily as needed (leg pain).   metoprolol succinate (TOPROL-XL) 50 MG 24 hr tablet Take 50 mg by mouth daily.   oxyCODONE-acetaminophen (PERCOCET/ROXICET) 5-325 MG tablet Take 1-2 tablets by mouth every 6 (six) hours as needed for moderate pain or severe pain.   Polyethyl Glycol-Propyl Glycol (SYSTANE OP) Place 1 drop into both eyes daily as needed (dry eyes).   simvastatin (ZOCOR) 20 MG tablet Take 20 mg by mouth daily.   sodium chloride (OCEAN) 0.65 % SOLN nasal spray Place 1-2 sprays into both nostrils as needed for congestion.     Allergies:   Meperidine and Vicodin [hydrocodone-acetaminophen]   Social History   Socioeconomic History   Marital status: Married    Spouse name: Not on file   Number of children: Not on file   Years of education: Not on file   Highest education level: Not on file  Occupational History   Not on file  Tobacco Use   Smoking status: Former    Packs/day: 0.50    Years: 25.00    Pack years: 12.50    Types: Cigarettes    Quit date: 12/17/1998    Years since quitting: 21.7   Smokeless tobacco: Never   Tobacco comments:    quit in 2000  Vaping Use   Vaping Use: Never used  Substance and Sexual Activity   Alcohol use: Not Currently    Comment: 1 beer occasionally   Drug use: Never   Sexual activity: Not on file  Other Topics Concern   Not on file  Social History Narrative   ** Merged History Encounter **       Social Determinants of Health   Financial Resource Strain: Not on file  Food Insecurity: Not on file  Transportation Needs: Not on file  Physical Activity: Not on file  Stress: Not on file  Social Connections: Not on file     Family History: The patient's family history includes Atrial fibrillation in his mother; Hypertension in his brother, brother, brother, brother, sister, sister, and sister; Liver cancer in his father; Lymphoma in his mother. ROS:   Please see the history of present illness.    All other  systems reviewed and are negative.  EKGs/Labs/Other Studies Reviewed:    The following studies were reviewed today:  EKG:  EKG ordered today and personally reviewed.  The ekg ordered today demonstrates sinus bradycardia 69 bpm borderline first-degree AV block otherwise normal EKG QRS duration is narrow no indication of 1C antiarrhythmic drug toxicity  Recent Labs: 12/29/2019: Platelets 152 01/01/2020: Hemoglobin 11.9 03/16/2020: BUN 10; Creatinine, Ser 0.87; NT-Pro BNP 127; Potassium 4.1; Sodium 139  Recent Lipid Panel No results found for: CHOL, TRIG, HDL, CHOLHDL, VLDL, LDLCALC, LDLDIRECT  Physical Exam:    VS:  BP Marland Kitchen)  144/75 (BP Location: Right Arm, Patient Position: Sitting, Cuff Size: Normal)   Pulse 61   Ht 6' (1.829 m)   Wt 195 lb 9.6 oz (88.7 kg)   SpO2 97%   BMI 26.53 kg/m     Wt Readings from Last 3 Encounters:  08/25/20 195 lb 9.6 oz (88.7 kg)  05/26/20 195 lb 9.6 oz (88.7 kg)  04/21/20 201 lb 6.4 oz (91.4 kg)     GEN:  Well nourished, well developed in no acute distress HEENT: Normal NECK: No JVD; No carotid bruits LYMPHATICS: No lymphadenopathy CARDIAC: RRR, no murmurs, rubs, gallops RESPIRATORY:  Clear to auscultation without rales, wheezing or rhonchi  ABDOMEN: Soft, non-tender, non-distended MUSCULOSKELETAL:  No edema; No deformity  SKIN: Warm and dry NEUROLOGIC:  Alert and oriented x 3 PSYCHIATRIC:  Normal affect    Signed, Shirlee More, MD  08/25/2020 3:41 PM    [facility]

## 2020-08-25 NOTE — Patient Instructions (Signed)

## 2020-08-31 DIAGNOSIS — C44519 Basal cell carcinoma of skin of other part of trunk: Secondary | ICD-10-CM | POA: Diagnosis not present

## 2020-10-03 ENCOUNTER — Encounter (HOSPITAL_COMMUNITY): Payer: Self-pay | Admitting: Emergency Medicine

## 2020-10-03 ENCOUNTER — Other Ambulatory Visit: Payer: Self-pay

## 2020-10-03 ENCOUNTER — Inpatient Hospital Stay (HOSPITAL_COMMUNITY)
Admission: EM | Admit: 2020-10-03 | Discharge: 2020-10-05 | DRG: 282 | Disposition: A | Discharge status: Home / Self Care | Payer: PPO | Attending: Internal Medicine | Admitting: Internal Medicine

## 2020-10-03 ENCOUNTER — Emergency Department (HOSPITAL_COMMUNITY): Payer: PPO

## 2020-10-03 DIAGNOSIS — I25110 Atherosclerotic heart disease of native coronary artery with unstable angina pectoris: Secondary | ICD-10-CM | POA: Diagnosis not present

## 2020-10-03 DIAGNOSIS — Z7901 Long term (current) use of anticoagulants: Secondary | ICD-10-CM | POA: Diagnosis not present

## 2020-10-03 DIAGNOSIS — Z87891 Personal history of nicotine dependence: Secondary | ICD-10-CM

## 2020-10-03 DIAGNOSIS — Z85828 Personal history of other malignant neoplasm of skin: Secondary | ICD-10-CM | POA: Diagnosis not present

## 2020-10-03 DIAGNOSIS — Z8673 Personal history of transient ischemic attack (TIA), and cerebral infarction without residual deficits: Secondary | ICD-10-CM

## 2020-10-03 DIAGNOSIS — Z79899 Other long term (current) drug therapy: Secondary | ICD-10-CM | POA: Diagnosis not present

## 2020-10-03 DIAGNOSIS — I214 Non-ST elevation (NSTEMI) myocardial infarction: Secondary | ICD-10-CM | POA: Diagnosis present

## 2020-10-03 DIAGNOSIS — I2510 Atherosclerotic heart disease of native coronary artery without angina pectoris: Secondary | ICD-10-CM

## 2020-10-03 DIAGNOSIS — Z885 Allergy status to narcotic agent status: Secondary | ICD-10-CM

## 2020-10-03 DIAGNOSIS — I10 Essential (primary) hypertension: Secondary | ICD-10-CM | POA: Diagnosis present

## 2020-10-03 DIAGNOSIS — G4730 Sleep apnea, unspecified: Secondary | ICD-10-CM | POA: Diagnosis present

## 2020-10-03 DIAGNOSIS — Z981 Arthrodesis status: Secondary | ICD-10-CM | POA: Diagnosis not present

## 2020-10-03 DIAGNOSIS — R0789 Other chest pain: Secondary | ICD-10-CM | POA: Diagnosis not present

## 2020-10-03 DIAGNOSIS — Z8249 Family history of ischemic heart disease and other diseases of the circulatory system: Secondary | ICD-10-CM | POA: Diagnosis not present

## 2020-10-03 DIAGNOSIS — I480 Paroxysmal atrial fibrillation: Secondary | ICD-10-CM | POA: Diagnosis present

## 2020-10-03 DIAGNOSIS — E785 Hyperlipidemia, unspecified: Secondary | ICD-10-CM | POA: Diagnosis present

## 2020-10-03 DIAGNOSIS — R079 Chest pain, unspecified: Secondary | ICD-10-CM | POA: Diagnosis not present

## 2020-10-03 DIAGNOSIS — Z20822 Contact with and (suspected) exposure to covid-19: Secondary | ICD-10-CM | POA: Diagnosis present

## 2020-10-03 DIAGNOSIS — Z888 Allergy status to other drugs, medicaments and biological substances status: Secondary | ICD-10-CM

## 2020-10-03 DIAGNOSIS — I4891 Unspecified atrial fibrillation: Secondary | ICD-10-CM | POA: Diagnosis present

## 2020-10-03 HISTORY — DX: Non-ST elevation (NSTEMI) myocardial infarction: I21.4

## 2020-10-03 LAB — COMPREHENSIVE METABOLIC PANEL
ALT: 21 U/L (ref 0–44)
AST: 21 U/L (ref 15–41)
Albumin: 4.2 g/dL (ref 3.5–5.0)
Alkaline Phosphatase: 67 U/L (ref 38–126)
Anion gap: 9 (ref 5–15)
BUN: 14 mg/dL (ref 8–23)
CO2: 22 mmol/L (ref 22–32)
Calcium: 9.6 mg/dL (ref 8.9–10.3)
Chloride: 108 mmol/L (ref 98–111)
Creatinine, Ser: 0.94 mg/dL (ref 0.61–1.24)
GFR, Estimated: >60 mL/min (ref >60)
Glucose, Bld: 114 mg/dL — ABNORMAL HIGH (ref 70–99)
Potassium: 3.9 mmol/L (ref 3.5–5.1)
Sodium: 139 mmol/L (ref 135–145)
Total Bilirubin: 0.7 mg/dL (ref 0.3–1.2)
Total Protein: 7.5 g/dL (ref 6.5–8.1)

## 2020-10-03 LAB — CBC
HCT: 40.9 % (ref 39.0–52.0)
Hemoglobin: 13.7 g/dL (ref 13.0–17.0)
MCH: 30.8 pg (ref 26.0–34.0)
MCHC: 33.5 g/dL (ref 30.0–36.0)
MCV: 91.9 fL (ref 80.0–100.0)
Platelets: 159 10*3/uL (ref 150–400)
RBC: 4.45 MIL/uL (ref 4.22–5.81)
RDW: 13.4 % (ref 11.5–15.5)
WBC: 5.6 10*3/uL (ref 4.0–10.5)
nRBC: 0 % (ref 0.0–0.2)

## 2020-10-03 LAB — TROPONIN I (HIGH SENSITIVITY)
Troponin I (High Sensitivity): 20 ng/L — ABNORMAL HIGH (ref <18)
Troponin I (High Sensitivity): 80 ng/L — ABNORMAL HIGH (ref <18)

## 2020-10-03 LAB — RESP PANEL BY RT-PCR (FLU A&B, COVID) ARPGX2
Influenza A by PCR: NEGATIVE
Influenza B by PCR: NEGATIVE
SARS Coronavirus 2 by RT PCR: NEGATIVE

## 2020-10-03 MED ORDER — AMLODIPINE BESYLATE 5 MG PO TABS
5.0000 mg | ORAL_TABLET | Freq: Every day | ORAL | Status: DC
  Administered 2020-10-04 – 2020-10-05 (×2): 5 mg via ORAL
  Filled 2020-10-03 (×2): qty 1

## 2020-10-03 MED ORDER — FENOFIBRIC ACID 135 MG PO CPDR: 135.0000 mg | DELAYED_RELEASE_CAPSULE | Freq: Every day | ORAL | Status: DC

## 2020-10-03 MED ORDER — ONDANSETRON HCL 4 MG/2ML IJ SOLN: 4.0000 mg | Freq: Four times a day (QID) | INTRAMUSCULAR | Status: DC | PRN

## 2020-10-03 MED ORDER — FENOFIBRATE 160 MG PO TABS
160.0000 mg | ORAL_TABLET | Freq: Every day | ORAL | Status: DC
  Administered 2020-10-04 – 2020-10-05 (×2): 160 mg via ORAL
  Filled 2020-10-03 (×2): qty 1

## 2020-10-03 MED ORDER — INFLUENZA VAC A&B SA ADJ QUAD 0.5 ML IM PRSY
0.5000 mL | PREFILLED_SYRINGE | Freq: Once | INTRAMUSCULAR | Status: DC
  Filled 2020-10-03: qty 0.5

## 2020-10-03 MED ORDER — HEPARIN (PORCINE) 25000 UT/250ML-% IV SOLN
1600.0000 [IU]/h | INTRAVENOUS | Status: DC
  Administered 2020-10-03: 1150 [IU]/h via INTRAVENOUS
  Administered 2020-10-04: 1600 [IU]/h via INTRAVENOUS
  Filled 2020-10-03 (×2): qty 250

## 2020-10-03 MED ORDER — METOPROLOL SUCCINATE ER 50 MG PO TB24: 50.0000 mg | ORAL_TABLET | Freq: Every day | ORAL | Status: DC

## 2020-10-03 MED ORDER — DOCUSATE SODIUM 100 MG PO CAPS
100.0000 mg | ORAL_CAPSULE | Freq: Two times a day (BID) | ORAL | Status: DC
  Administered 2020-10-05: 100 mg via ORAL
  Filled 2020-10-03 (×2): qty 1

## 2020-10-03 MED ORDER — NITROGLYCERIN 0.4 MG SL SUBL
0.4000 mg | SUBLINGUAL_TABLET | SUBLINGUAL | Status: DC | PRN
  Administered 2020-10-03 (×2): 0.4 mg via SUBLINGUAL
  Filled 2020-10-03 (×2): qty 1

## 2020-10-03 MED ORDER — SIMVASTATIN 20 MG PO TABS: 20.0000 mg | ORAL_TABLET | Freq: Every day | ORAL | Status: DC

## 2020-10-03 MED ORDER — ACETAMINOPHEN 325 MG PO TABS
650.0000 mg | ORAL_TABLET | Freq: Four times a day (QID) | ORAL | Status: DC | PRN
  Administered 2020-10-03: 650 mg via ORAL
  Filled 2020-10-03: qty 2

## 2020-10-03 MED ORDER — ASPIRIN EC 81 MG PO TBEC
81.0000 mg | DELAYED_RELEASE_TABLET | Freq: Every day | ORAL | Status: DC
  Administered 2020-10-04 – 2020-10-05 (×2): 81 mg via ORAL
  Filled 2020-10-03 (×2): qty 1

## 2020-10-03 MED ORDER — ASPIRIN 81 MG PO CHEW
324.0000 mg | CHEWABLE_TABLET | Freq: Once | ORAL | Status: AC
  Administered 2020-10-03: 324 mg via ORAL
  Filled 2020-10-03: qty 4

## 2020-10-03 NOTE — ED Provider Notes (Signed)
[facility] EMERGENCY DEPARTMENT Provider Note   CSN: [id number] Arrival date & time: 10/03/20  1207     History Chief Complaint  Patient presents with   Chest Pain    Benjamin Griffin is a 70 y.o. male who presents emergency department chief complaint of left-sided chest pressure.  Had sudden onset of pressure in the left chest starting at 9 AM today.  He has associated pain.  He rates the intensity at 5 out of 10 for both.  At times the pain and pressure radiated bilaterally into his neck. He denies shortness of breath, nausea, vomiting, diaphoresis or exertional chest pain or dyspnea.  Patient states that he felt like he might have heartburn and tried taking a coat and taking Tums without relief.  He was seen at an urgent care this morning and sent to the ER.  Cardiac risk factors include age, male sex, hypertension, hyperlipidemia. Patient continues to have sensation in the left side of his chest at this time. He has a past medical history of atrial fibrillation and is on Eliquis twice daily.   Chest Pain     Past Medical History:  Diagnosis Date   Arthritis    Cancer ([place])    skin cancer, removed from bilateral arms   History of kidney stones    Hyperlipidemia    Hypertension    Palpitations    Sleep apnea    CPAP   Stroke ([place])    approx 2000, on plavix. When found on CT, was told it was an "old stroke"    Patient Active Problem List   Diagnosis Date Noted   DDD (degenerative disc disease), lumbar 04/21/2020   Dyslipidemia 04/21/2020   Atrial fibrillation with rapid ventricular response ([place]) 04/21/2020   Hypertensive heart disease with heart failure ([place]) 03/02/2020   Stroke ([place])    Sleep apnea    Palpitations    Hypertension    Hyperlipidemia    History of kidney stones    Cancer ([place])    Arthritis    Lumbar radiculopathy 01/01/2020   Hypertrophy of bone, right ankle and foot 06/03/2019    Past Surgical History:  Procedure Laterality  Date   APPENDECTOMY     CARDIAC CATHETERIZATION     approx year 2000 @ [place] after stroke   CATARACT EXTRACTION, BILATERAL     EYE SURGERY     HERNIA REPAIR     LOOP RECORDER INSERTION     approx 2000    LOOP RECORDER REMOVAL     1 week after instertion, approx 2000   NASAL SINUS SURGERY     TRANSFORAMINAL LUMBAR INTERBODY FUSION W/ MIS 1 LEVEL N/A 01/01/2020   Procedure: TRANSFORAMINAL LUMBAR INTERBODY FUSION (TLIF) L4-L5;  Surgeon: Vallarie Mare, MD;  Location: [place];  Service: Neurosurgery;  Laterality: N/A;  TRANSFORAMINAL LUMBAR INTERBODY FUSION (TLIF) [id number]    UMBILICAL HERNIA REPAIR         Family History  Problem Relation Age of Onset   Lymphoma Mother    Atrial fibrillation Mother    Liver cancer Father    Hypertension Sister    Hypertension Brother    Hypertension Brother    Hypertension Brother    Hypertension Brother    Hypertension Sister    Hypertension Sister     Social History   Tobacco Use   Smoking status: Former    Packs/day: 0.50    Years: 25.00    Pack years: 12.50  Types: Cigarettes    Quit date: 12/17/1998    Years since quitting: 21.8   Smokeless tobacco: Never   Tobacco comments:    quit in 2000  Vaping Use   Vaping Use: Never used  Substance Use Topics   Alcohol use: Not Currently    Comment: 1 beer occasionally   Drug use: Never    Home Medications Prior to Admission medications   Medication Sig Start Date End Date Taking? Authorizing Provider  amLODipine (NORVASC) 5 MG tablet Take 1 tablet (5 mg total) by mouth daily. 03/02/20 10/03/20 Yes Richardo Priest, MD  Choline Fenofibrate (FENOFIBRIC ACID) 135 MG CPDR Take 135 mg by mouth daily. 09/05/19  Yes [provider]  ELIQUIS 5 MG TABS tablet Take 1 tablet (5 mg total) by mouth 2 (two) times daily. 03/02/20  Yes Richardo Priest, MD  flecainide (TAMBOCOR) 50 MG tablet Take 1 tablet (50 mg total) by mouth 2 (two) times daily. 03/02/20  Yes Richardo Priest, MD   metoprolol succinate (TOPROL-XL) 50 MG 24 hr tablet Take 50 mg by mouth daily. 09/21/19  Yes [provider]  simvastatin (ZOCOR) 20 MG tablet Take 20 mg by mouth daily. 11/05/19  Yes [provider]  cyclobenzaprine (FLEXERIL) 10 MG tablet Take 1 tablet (10 mg total) by mouth 3 (three) times daily as needed for muscle spasms. Patient not taking: Reported on 10/03/2020 01/02/20   Vallarie Mare, MD  docusate sodium (COLACE) 100 MG capsule Take 1 capsule (100 mg total) by mouth 2 (two) times daily. Patient not taking: Reported on 10/03/2020 01/02/20   Vallarie Mare, MD  oxyCODONE-acetaminophen (PERCOCET/ROXICET) 5-325 MG tablet Take 1-2 tablets by mouth every 6 (six) hours as needed for moderate pain or severe pain. Patient not taking: No sig reported 01/02/20   Vallarie Mare, MD    Allergies    Meperidine and Vicodin [hydrocodone-acetaminophen]  Review of Systems   Review of Systems  Cardiovascular:  Positive for chest pain.  Ten systems reviewed and are negative for acute change, except as noted in the HPI.   Physical Exam Updated Vital Signs BP 140/84 (BP Location: Right Arm)   Pulse (!) 59   Temp 98 F (36.7 C)   Resp 14   SpO2 98%   Physical Exam Vitals and nursing note reviewed.  Constitutional:      General: He is not in acute distress.    Appearance: He is well-developed. He is not diaphoretic.  HENT:     Head: Normocephalic and atraumatic.  Eyes:     General: No scleral icterus.    Conjunctiva/sclera: Conjunctivae normal.  Cardiovascular:     Rate and Rhythm: Normal rate and regular rhythm.     Heart sounds: Normal heart sounds.  Pulmonary:     Effort: Pulmonary effort is normal. No respiratory distress.     Breath sounds: Normal breath sounds.  Abdominal:     Palpations: Abdomen is soft.     Tenderness: There is no abdominal tenderness.  Musculoskeletal:     Cervical back: Normal range of motion and neck supple.  Skin:     General: Skin is warm and dry.  Neurological:     Mental Status: He is alert.  Psychiatric:        Behavior: Behavior normal.    ED Results / Procedures / Treatments   Labs (all labs ordered are listed, but only abnormal results are displayed) Labs Reviewed  COMPREHENSIVE METABOLIC PANEL - Abnormal;  Notable for the following components:      Result Value   Glucose, Bld 114 (*)    All other components within normal limits  TROPONIN I (HIGH SENSITIVITY) - Abnormal; Notable for the following components:   Troponin I (High Sensitivity) 20 (*)    All other components within normal limits  CBC  TROPONIN I (HIGH SENSITIVITY)    EKG None  Radiology DG Chest 2 View  Result Date: 10/03/2020 CLINICAL DATA:  Chest pain. EXAM: CHEST - 2 VIEW COMPARISON:  January 06, 2020 FINDINGS: The heart size and mediastinal contours are within normal limits. Both lungs are clear. The visualized skeletal structures are unremarkable. IMPRESSION: No active cardiopulmonary disease. Electronically Signed   By: Dorise Bullion III M.D.   On: 10/03/2020 13:31    Procedures .Critical Care Performed by: Margarita Mail, PA-C Authorized by: Margarita Mail, PA-C   Critical care provider statement:    Critical care time (minutes):  45   Critical care time was exclusive of:  Separately billable procedures and treating other patients   Critical care was necessary to treat or prevent imminent or life-threatening deterioration of the following conditions:  Cardiac failure   Critical care was time spent personally by me on the following activities:  Discussions with consultants, evaluation of patient's response to treatment, examination of patient, ordering and performing treatments and interventions, ordering and review of laboratory studies, ordering and review of radiographic studies, pulse oximetry, re-evaluation of patient's condition, obtaining history from patient or surrogate and review of old charts    Medications Ordered in ED Medications  nitroGLYCERIN (NITROSTAT) SL tablet 0.4 mg (has no administration in time range)  aspirin chewable tablet 324 mg (has no administration in time range)    ED Course  I have reviewed the triage vital signs and the nursing notes.  Pertinent labs & imaging results that were available during my care of the patient were reviewed by me and considered in my medical decision making (see chart for details).  Clinical Course as of 10/04/20 1017  Sun Oct 03, 2020  1708 Case discussed with Cadilogist Dr. Debara Pickett,  Asks that we Hold fleccanide and eliquis. Cardiology will consult on the patient and decide if they will admit or ask for medicine admit. [AH]    Clinical Course User Index [AH] Margarita Mail, PA-C   MDM Rules/Calculators/A&P                           [id number] pain VS:  98.4 F (36.9 C) 67 18 154/79 (Abnormal)   97 %   [id number] is gathered by patient  and wife at bedside. Previous records obtained and reviewed. DDX:The patient's complaint of chest pain involves an extensive number of diagnostic and treatment options, and is a complaint that carries with it a high risk of complications, morbidity, and potential mortality. Given the large differential diagnosis, medical decision making is of high complexity. The emergent differential diagnosis of chest pain includes: Acute coronary syndrome, pericarditis, aortic dissection, pulmonary embolism, tension pneumothorax, pneumonia, and esophageal rupture.  Labs: I ordered reviewed and interpreted labs which included CBC shows mild normocytic anemia, PT/INR mildly elevated, APTT mildly elevated, cMP with mildly elevated blood glucose likely acute phase reaction, respiratory panel is negative for COVID or influenza. Troponin initially elevated at 20 now rising to 80 Imaging: I ordered and reviewed images which included 2 view chest x-ray. I independently visualized and interpreted all imaging. There  are  no acute, significant findings on today's images. EKG: Rhythm at a rate of 67, serial EKG shows no ischemic changes Consults: Dr. Debara Pickett with cardiology for suspected NSTEMI MDM: 69 year old male who presents emergency department with chest pressure with elevating serial high-sensitivity troponins.  Doubt hypertensive emergency.  I suspect that this is NSTEMI.  Cardiology has consulted and will admit the patient.  Patient intermittently aspirin and nitroglycerin. Patient disposition:The patient appears reasonably stabilized for admission considering the current resources, flow, and capabilities available in the ED at this time, and I doubt any other [facility] requiring further screening and/or treatment in the ED prior to admission.       Final Clinical Impression(s) / ED Diagnoses Final diagnoses:  None    Rx / DC Orders ED Discharge Orders     None        Margarita Mail, PA-C 10/04/20 [place], [place], MD 10/06/20 2356

## 2020-10-03 NOTE — H&P (Signed)
Cardiology Admission History and Physical:   Patient ID: Benjamin Griffin MRN: [id number]; DOB: Apr 14, 1950   Admission date: 10/03/2020  PCP:  Ronita Hipps, MD   [place] Providers Cardiologist:  None        Chief Complaint:  Chest pain  Patient Profile:   Benjamin Griffin is a 70 y.o. male with Atrial fibrillation who is being seen 10/03/2020 for the evaluation of chest pain.   History of Present Illness:   Mr. Staib 70 yr old who presents with chest pain with mildly elevated troponins that are trending up from 20=>80. Patient is on Eliquis at home for Afib and last dose was this AM. Chest pain that began around 9 AM this morning.  Is been constant and localized to the left chest.  Pain did radiate to his neck for a short while but nothing since.  Pain has been 5 out of 10 since morning.  He dug a hole about 2 hours prior to the symptoms for varying the dog.  Denies any diaphoresis with the chest pain. denies any shortness of breath, syncope, leg pain, leg swelling, nausea, vomiting.  Has not had any cardiac problems prior.  He has atrial fibrillation that is paroxysmal and is on Eliquis.  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   Past Medical History:  Diagnosis Date   Arthritis    Cancer ([place])    skin cancer, removed from bilateral arms   History of kidney stones    Hyperlipidemia    Hypertension    Palpitations    Sleep apnea    CPAP   Stroke ([place])    approx 2000, on plavix. When found on CT, was told it was an "old stroke"    Past Surgical History:  Procedure Laterality Date   APPENDECTOMY     CARDIAC CATHETERIZATION     approx year 2000 @ [place] after stroke   CATARACT EXTRACTION, BILATERAL     EYE SURGERY     HERNIA REPAIR     LOOP RECORDER INSERTION     approx 2000    LOOP RECORDER REMOVAL     1 week after instertion, approx 2000   NASAL SINUS SURGERY     TRANSFORAMINAL LUMBAR INTERBODY FUSION W/ MIS 1 LEVEL N/A 01/01/2020   Procedure:  TRANSFORAMINAL LUMBAR INTERBODY FUSION (TLIF) L4-L5;  Surgeon: Vallarie Mare, MD;  Location: [place];  Service: Neurosurgery;  Laterality: N/A;  TRANSFORAMINAL LUMBAR INTERBODY FUSION (TLIF) [id number]    UMBILICAL HERNIA REPAIR       Medications Prior to Admission: Prior to Admission medications   Medication Sig Start Date End Date Taking? Authorizing Provider  amLODipine (NORVASC) 5 MG tablet Take 1 tablet (5 mg total) by mouth daily. 03/02/20 10/03/20 Yes Richardo Priest, MD  Choline Fenofibrate (FENOFIBRIC ACID) 135 MG CPDR Take 135 mg by mouth daily. 09/05/19  Yes [provider]  ELIQUIS 5 MG TABS tablet Take 1 tablet (5 mg total) by mouth 2 (two) times daily. 03/02/20  Yes Richardo Priest, MD  flecainide (TAMBOCOR) 50 MG tablet Take 1 tablet (50 mg total) by mouth 2 (two) times daily. 03/02/20  Yes Richardo Priest, MD  metoprolol succinate (TOPROL-XL) 50 MG 24 hr tablet Take 50 mg by mouth daily. 09/21/19  Yes [provider]  simvastatin (ZOCOR) 20 MG tablet Take 20 mg by mouth daily. 11/05/19  Yes [provider]  cyclobenzaprine (FLEXERIL) 10 MG tablet Take 1 tablet (10  mg total) by mouth 3 (three) times daily as needed for muscle spasms. Patient not taking: Reported on 10/03/2020 01/02/20   Vallarie Mare, MD  docusate sodium (COLACE) 100 MG capsule Take 1 capsule (100 mg total) by mouth 2 (two) times daily. Patient not taking: Reported on 10/03/2020 01/02/20   Vallarie Mare, MD  oxyCODONE-acetaminophen (PERCOCET/ROXICET) 5-325 MG tablet Take 1-2 tablets by mouth every 6 (six) hours as needed for moderate pain or severe pain. Patient not taking: No sig reported 01/02/20   Vallarie Mare, MD     Allergies:    Allergies  Allergen Reactions   Meperidine Other (See Comments)    Sweating    Vicodin [Hydrocodone-Acetaminophen] Nausea And Vomiting    Social History:   Social History   Socioeconomic History   Marital status: Married    Spouse name:  Not on file   Number of children: Not on file   Years of education: Not on file   Highest education level: Not on file  Occupational History   Not on file  Tobacco Use   Smoking status: Former    Packs/day: 0.50    Years: 25.00    Pack years: 12.50    Types: Cigarettes    Quit date: 12/17/1998    Years since quitting: 21.8   Smokeless tobacco: Never   Tobacco comments:    quit in 2000  Vaping Use   Vaping Use: Never used  Substance and Sexual Activity   Alcohol use: Not Currently    Comment: 1 beer occasionally   Drug use: Never   Sexual activity: Not on file  Other Topics Concern   Not on file  Social History Narrative   ** Merged History Encounter **       Social Determinants of Health   Financial Resource Strain: Not on file  Food Insecurity: Not on file  Transportation Needs: Not on file  Physical Activity: Not on file  Stress: Not on file  Social Connections: Not on file  Intimate Partner Violence: Not on file    Family History:  The patient's family history includes Atrial fibrillation in his mother; Hypertension in his brother, brother, brother, brother, sister, sister, and sister; Liver cancer in his father; Lymphoma in his mother.    ROS:  Please see the history of present illness.  All other ROS reviewed and negative.     Physical Exam/Data:   Vitals:   10/03/20 1545 10/03/20 1600 10/03/20 1615 10/03/20 1630  BP: (!) 155/90 (!) 182/96 (!) 155/82 (!) 172/90  Pulse: (!) 59 60 (!) 59 66  Resp: '18 14 15 13  '$ Temp:      TempSrc:      SpO2: 100% 100% 99% 98%   No intake or output data in the 24 hours ending 10/03/20 1719 Last 3 Weights 08/25/2020 05/26/2020 04/21/2020  Weight (lbs) 195 lb 9.6 oz 195 lb 9.6 oz 201 lb 6.4 oz  Weight (kg) 88.724 kg 88.724 kg 91.354 kg     There is no height or weight on file to calculate BMI.  General:  Well nourished, well developed, in no acute distress HEENT: normal Neck: no JVD Vascular: No carotid bruits; Distal  pulses 2+ bilaterally   Cardiac:  normal S1, S2; RRR; no murmur  Lungs:  clear to auscultation bilaterally, no wheezing, rhonchi or rales  Abd: soft, nontender, no hepatomegaly  Ext: no edema Musculoskeletal:  No deformities, BUE and BLE strength normal and equal Skin: warm and dry  Neuro:  CNs 2-12 intact, no focal abnormalities noted Psych:  Normal affect    EKG:  The ECG that was done I was personally reviewed and demonstrates Sinus rhythm.  Relevant CV Studies: None  Laboratory Data:  High Sensitivity Troponin:   Recent Labs  Lab 10/03/20 1239 10/03/20 1439  TROPONINIHS 20* 80*      Chemistry Recent Labs  Lab 10/03/20 1239  NA 139  K 3.9  CL 108  CO2 22  GLUCOSE 114*  BUN 14  CREATININE 0.94  CALCIUM 9.6  GFRNONAA >60  ANIONGAP 9    Recent Labs  Lab 10/03/20 1239  PROT 7.5  ALBUMIN 4.2  AST 21  ALT 21  ALKPHOS 67  BILITOT 0.7   Lipids No results for input(s): CHOL, TRIG, HDL, LABVLDL, LDLCALC, CHOLHDL in the last 168 hours. Hematology Recent Labs  Lab 10/03/20 1239  WBC 5.6  RBC 4.45  HGB 13.7  HCT 40.9  MCV 91.9  MCH 30.8  MCHC 33.5  RDW 13.4  PLT 159   Thyroid No results for input(s): TSH, FREET4 in the last 168 hours. BNPNo results for input(s): BNP, PROBNP in the last 168 hours.  DDimer No results for input(s): DDIMER in the last 168 hours.   Radiology/Studies:  DG Chest 2 View  Result Date: 10/03/2020 CLINICAL DATA:  Chest pain. EXAM: CHEST - 2 VIEW COMPARISON:  January 06, 2020 FINDINGS: The heart size and mediastinal contours are within normal limits. Both lungs are clear. The visualized skeletal structures are unremarkable. IMPRESSION: No active cardiopulmonary disease. Electronically Signed   By: Dorise Bullion III M.D.   On: 10/03/2020 13:31     Assessment and Plan:   NSTEMI: Patient has noncardiac chest pain placing him at intermediate risk probability based on his age and sex.  He has a minimal rise of troponin with no  EKG changes.  Given his profile, I would treat this patient as an NSTEMI and plan for heart catheterization.  We will start the patient on NSTEMI protocol.  2. Atrial fibrillation: We will stop Eliquis and start the patient on heparin.  The patient is in sinus rhythm.  3. Hypertension: Fair control.  No change in the medications at this time.   Risk Assessment/Risk Scores:    TIMI Risk Score for Unstable Angina or Non-ST Elevation MI:   The patient's TIMI risk score is 3, which indicates a 13% risk of all cause mortality, new or recurrent myocardial infarction or need for urgent revascularization in the next 14 days.    CHA2DS2-VASc Score =  4       Severity of Illness: The appropriate patient status for this patient is OBSERVATION. Observation status is judged to be reasonable and necessary in order to provide the required intensity of service to ensure the patient's safety. The patient's presenting symptoms, physical exam findings, and initial radiographic and laboratory data in the context of their medical condition is felt to place them at decreased risk for further clinical deterioration. Furthermore, it is anticipated that the patient will be medically stable for discharge from the hospital within 2 midnights of admission. The following factors support the patient status of observation.   " The patient's presenting symptoms include chest pain. " The physical exam findings include none. " The initial radiographic and laboratory data are elevated trops.   For questions or updates, please contact [employer] Please consult [email or web address] for contact info under     Signed, Robinette Haines, MD  10/03/2020 5:19 PM

## 2020-10-03 NOTE — ED Provider Notes (Signed)
Emergency Medicine Provider Triage Evaluation Note  Benjamin Griffin , a 70 y.o. male  was evaluated in triage.  Pt complains of chest pain that began around 9 AM this morning.  Is been constant and localized to the left chest.  Pain does not radiate.  Denies any shortness of breath, syncope, leg pain, leg swelling, diaphoresis, nausea, vomiting.  Review of Systems  Positive:  Negative: See above  Physical Exam  BP (!) 154/79 (BP Location: Left Arm)   Pulse 67   Temp 98.4 F (36.9 C) (Oral)   Resp 18   SpO2 97%  Gen:   Awake, no distress   Resp:  Normal effort, clear to auscultation bilaterally MSK:   Moves extremities without difficulty  Other:  Heart rate is normal and regular.  Medical Decision Making  Medically screening exam initiated at 12:40 PM.  Appropriate orders placed.  Benjamin Griffin was informed that the remainder of the evaluation will be completed by another provider, this initial triage assessment does not replace that evaluation, and the importance of remaining in the ED until their evaluation is complete.     Myna Bright [place], PA-C 10/03/20 [place], [place]kit, MD 10/05/20 [phone]

## 2020-10-03 NOTE — Progress Notes (Signed)
ANTICOAGULATION CONSULT NOTE - Initial Consult  Pharmacy Consult for heparin Indication: chest pain/ACS  Allergies  Allergen Reactions   Meperidine Other (See Comments)    Sweating    Vicodin [Hydrocodone-Acetaminophen] Nausea And Vomiting    Patient Measurements:   Heparin Dosing Weight: 88 kg   Vital Signs: Temp: 98 F (36.7 C) (09/18 1511) Temp Source: Oral (09/18 1221) BP: 182/96 (09/18 1600) Pulse Rate: 60 (09/18 1600)  Labs: Recent Labs    10/03/20 1239 10/03/20 1439  HGB 13.7  --   HCT 40.9  --   PLT 159  --   CREATININE 0.94  --   TROPONINIHS 20* 80*    CrCl cannot be calculated (Unknown ideal weight.).   Medical History: Past Medical History:  Diagnosis Date   Arthritis    Cancer ([place])    skin cancer, removed from bilateral arms   History of kidney stones    Hyperlipidemia    Hypertension    Palpitations    Sleep apnea    CPAP   Stroke ([place])    approx 2000, on plavix. When found on CT, was told it was an "old stroke"    Medications:  (Not in a hospital admission)   Assessment: 85 YOM who presents with chest pain with mildly elevated troponins that are trending up. Pharmacy consulted to start IV heparin for ACS. Of note, patient is on Eliquis at home for Afib and last dose was this AM.   H/H and Plt wnl. SCr wnl   Goal of Therapy:  Heparin level 0.3-0.7 units/ml aPTT 66-102 seconds Monitor platelets by anticoagulation protocol: Yes   Plan:  -Start heparin at 1150 units/hr at 8 PM today. No bolus  -F/u 6 hr HL and aPTT -Monitor daily HL, aPTT, CBC and s/s of bleeding   Albertina Parr, PharmD., BCPS, BCCCP Clinical Pharmacist Please refer to [facility] for unit-specific pharmacist

## 2020-10-03 NOTE — ED Triage Notes (Signed)
Pt reports pain to L chest since 9am.  Denies any other associated symptoms.  Drank pepsi and coke because he thought it may be indigestion without improvement.

## 2020-10-04 ENCOUNTER — Observation Stay (HOSPITAL_COMMUNITY): Payer: PPO

## 2020-10-04 ENCOUNTER — Encounter (HOSPITAL_COMMUNITY)
Admission: EM | Disposition: A | Discharge status: Home / Self Care | Payer: Self-pay | Source: Home / Self Care | Attending: Internal Medicine

## 2020-10-04 ENCOUNTER — Encounter (HOSPITAL_COMMUNITY): Payer: Self-pay | Admitting: Internal Medicine

## 2020-10-04 DIAGNOSIS — Z85828 Personal history of other malignant neoplasm of skin: Secondary | ICD-10-CM | POA: Diagnosis not present

## 2020-10-04 DIAGNOSIS — I214 Non-ST elevation (NSTEMI) myocardial infarction: Secondary | ICD-10-CM

## 2020-10-04 DIAGNOSIS — Z20822 Contact with and (suspected) exposure to covid-19: Secondary | ICD-10-CM | POA: Diagnosis present

## 2020-10-04 DIAGNOSIS — I4891 Unspecified atrial fibrillation: Secondary | ICD-10-CM | POA: Diagnosis not present

## 2020-10-04 DIAGNOSIS — Z981 Arthrodesis status: Secondary | ICD-10-CM | POA: Diagnosis not present

## 2020-10-04 DIAGNOSIS — I2510 Atherosclerotic heart disease of native coronary artery without angina pectoris: Secondary | ICD-10-CM | POA: Diagnosis present

## 2020-10-04 DIAGNOSIS — Z885 Allergy status to narcotic agent status: Secondary | ICD-10-CM | POA: Diagnosis not present

## 2020-10-04 DIAGNOSIS — Z79899 Other long term (current) drug therapy: Secondary | ICD-10-CM | POA: Diagnosis not present

## 2020-10-04 DIAGNOSIS — Z7901 Long term (current) use of anticoagulants: Secondary | ICD-10-CM | POA: Diagnosis not present

## 2020-10-04 DIAGNOSIS — E785 Hyperlipidemia, unspecified: Secondary | ICD-10-CM | POA: Diagnosis present

## 2020-10-04 DIAGNOSIS — I10 Essential (primary) hypertension: Secondary | ICD-10-CM | POA: Diagnosis present

## 2020-10-04 DIAGNOSIS — G4730 Sleep apnea, unspecified: Secondary | ICD-10-CM | POA: Diagnosis present

## 2020-10-04 DIAGNOSIS — Z87891 Personal history of nicotine dependence: Secondary | ICD-10-CM | POA: Diagnosis not present

## 2020-10-04 DIAGNOSIS — Z8673 Personal history of transient ischemic attack (TIA), and cerebral infarction without residual deficits: Secondary | ICD-10-CM | POA: Diagnosis not present

## 2020-10-04 DIAGNOSIS — Z8249 Family history of ischemic heart disease and other diseases of the circulatory system: Secondary | ICD-10-CM | POA: Diagnosis not present

## 2020-10-04 DIAGNOSIS — I25110 Atherosclerotic heart disease of native coronary artery with unstable angina pectoris: Secondary | ICD-10-CM | POA: Diagnosis not present

## 2020-10-04 DIAGNOSIS — I480 Paroxysmal atrial fibrillation: Secondary | ICD-10-CM | POA: Diagnosis present

## 2020-10-04 HISTORY — PX: LEFT HEART CATH AND CORONARY ANGIOGRAPHY: CATH118249

## 2020-10-04 LAB — LIPID PANEL
Cholesterol: 107 mg/dL (ref 0–200)
HDL: 32 mg/dL — ABNORMAL LOW (ref >40)
LDL Cholesterol: 57 mg/dL (ref 0–99)
Total CHOL/HDL Ratio: 3.3 RATIO
Triglycerides: 89 mg/dL (ref <150)
VLDL: 18 mg/dL (ref 0–40)

## 2020-10-04 LAB — PROTIME-INR
INR: 1.3 — ABNORMAL HIGH (ref 0.8–1.2)
Prothrombin Time: 16.3 seconds — ABNORMAL HIGH (ref 11.4–15.2)

## 2020-10-04 LAB — CBC
HCT: 37.2 % — ABNORMAL LOW (ref 39.0–52.0)
Hemoglobin: 12.9 g/dL — ABNORMAL LOW (ref 13.0–17.0)
MCH: 31.4 pg (ref 26.0–34.0)
MCHC: 34.7 g/dL (ref 30.0–36.0)
MCV: 90.5 fL (ref 80.0–100.0)
Platelets: 130 10*3/uL — ABNORMAL LOW (ref 150–400)
RBC: 4.11 MIL/uL — ABNORMAL LOW (ref 4.22–5.81)
RDW: 13.3 % (ref 11.5–15.5)
WBC: 5.5 10*3/uL (ref 4.0–10.5)
nRBC: 0 % (ref 0.0–0.2)

## 2020-10-04 LAB — BASIC METABOLIC PANEL
Anion gap: 7 (ref 5–15)
BUN: 12 mg/dL (ref 8–23)
CO2: 24 mmol/L (ref 22–32)
Calcium: 9.4 mg/dL (ref 8.9–10.3)
Chloride: 107 mmol/L (ref 98–111)
Creatinine, Ser: 0.79 mg/dL (ref 0.61–1.24)
GFR, Estimated: >60 mL/min (ref >60)
Glucose, Bld: 95 mg/dL (ref 70–99)
Potassium: 3.9 mmol/L (ref 3.5–5.1)
Sodium: 138 mmol/L (ref 135–145)

## 2020-10-04 LAB — ECHOCARDIOGRAM COMPLETE
AR max vel: 3.35 cm2
AV Area VTI: 3.79 cm2
AV Area mean vel: 3.5 cm2
AV Mean grad: 3 mmHg
AV Peak grad: 7.2 mmHg
Ao pk vel: 1.34 m/s
Area-P 1/2: 3.24 cm2
Height: 72 in
P 1/2 time: 526 msec
S' Lateral: 3.3 cm
Weight: 3132.8 oz

## 2020-10-04 LAB — APTT
aPTT: 45 seconds — ABNORMAL HIGH (ref 24–36)
aPTT: 54 seconds — ABNORMAL HIGH (ref 24–36)

## 2020-10-04 LAB — HEPARIN LEVEL (UNFRACTIONATED): Heparin Unfractionated: >1.1 IU/mL — ABNORMAL HIGH (ref 0.30–0.70)

## 2020-10-04 LAB — HEMOGLOBIN A1C
Hgb A1c MFr Bld: 5 % (ref 4.8–5.6)
Mean Plasma Glucose: 96.8 mg/dL

## 2020-10-04 LAB — TROPONIN I (HIGH SENSITIVITY): Troponin I (High Sensitivity): 1175 ng/L (ref <18)

## 2020-10-04 LAB — HIV ANTIBODY (ROUTINE TESTING W REFLEX): HIV Screen 4th Generation wRfx: NONREACTIVE

## 2020-10-04 SURGERY — LEFT HEART CATH AND CORONARY ANGIOGRAPHY
Anesthesia: LOCAL

## 2020-10-04 MED ORDER — SODIUM CHLORIDE 0.9% FLUSH: 3.0000 mL | INTRAVENOUS | Status: DC | PRN

## 2020-10-04 MED ORDER — SODIUM CHLORIDE 0.9 % WEIGHT BASED INFUSION
3.0000 mL/kg/h | INTRAVENOUS | Status: DC
  Administered 2020-10-04: 3 mL/kg/h via INTRAVENOUS

## 2020-10-04 MED ORDER — HEPARIN (PORCINE) IN NACL 1000-0.9 UT/500ML-% IV SOLN
INTRAVENOUS | Status: DC | PRN
  Administered 2020-10-04 (×2): 500 mL

## 2020-10-04 MED ORDER — SODIUM CHLORIDE 0.9 % IV SOLN: 250.0000 mL | INTRAVENOUS | Status: DC | PRN

## 2020-10-04 MED ORDER — LIDOCAINE HCL (PF) 1 % IJ SOLN
INTRAMUSCULAR | Status: AC
  Filled 2020-10-04: qty 30

## 2020-10-04 MED ORDER — IOHEXOL 350 MG/ML SOLN
INTRAVENOUS | Status: DC | PRN
  Administered 2020-10-04: 50 mL

## 2020-10-04 MED ORDER — LABETALOL HCL 5 MG/ML IV SOLN: 10.0000 mg | INTRAVENOUS | Status: AC | PRN

## 2020-10-04 MED ORDER — HEPARIN SODIUM (PORCINE) 1000 UNIT/ML IJ SOLN
INTRAMUSCULAR | Status: DC | PRN
  Administered 2020-10-04: 5000 [IU] via INTRAVENOUS

## 2020-10-04 MED ORDER — ATORVASTATIN CALCIUM 80 MG PO TABS
80.0000 mg | ORAL_TABLET | Freq: Every day | ORAL | Status: DC
  Administered 2020-10-04 – 2020-10-05 (×2): 80 mg via ORAL
  Filled 2020-10-04 (×2): qty 1

## 2020-10-04 MED ORDER — VERAPAMIL HCL 2.5 MG/ML IV SOLN
INTRAVENOUS | Status: AC
  Filled 2020-10-04: qty 2

## 2020-10-04 MED ORDER — MIDAZOLAM HCL 2 MG/2ML IJ SOLN
INTRAMUSCULAR | Status: DC | PRN
  Administered 2020-10-04: 1 mg via INTRAVENOUS

## 2020-10-04 MED ORDER — HYDRALAZINE HCL 20 MG/ML IJ SOLN: 10.0000 mg | INTRAMUSCULAR | Status: AC | PRN

## 2020-10-04 MED ORDER — SODIUM CHLORIDE 0.9 % WEIGHT BASED INFUSION: 3.0000 mL/kg/h | INTRAVENOUS | Status: DC

## 2020-10-04 MED ORDER — VERAPAMIL HCL 2.5 MG/ML IV SOLN
INTRAVENOUS | Status: DC | PRN
  Administered 2020-10-04: 10 mL via INTRA_ARTERIAL

## 2020-10-04 MED ORDER — LIDOCAINE HCL (PF) 1 % IJ SOLN
INTRAMUSCULAR | Status: DC | PRN
  Administered 2020-10-04: 2 mL

## 2020-10-04 MED ORDER — ACETAMINOPHEN 325 MG PO TABS: 650.0000 mg | ORAL_TABLET | ORAL | Status: DC | PRN

## 2020-10-04 MED ORDER — ONDANSETRON HCL 4 MG/2ML IJ SOLN: 4.0000 mg | Freq: Four times a day (QID) | INTRAMUSCULAR | Status: DC | PRN

## 2020-10-04 MED ORDER — HEPARIN SODIUM (PORCINE) 1000 UNIT/ML IJ SOLN
INTRAMUSCULAR | Status: AC
  Filled 2020-10-04: qty 1

## 2020-10-04 MED ORDER — APIXABAN 5 MG PO TABS
5.0000 mg | ORAL_TABLET | Freq: Two times a day (BID) | ORAL | Status: DC
  Administered 2020-10-05: 5 mg via ORAL
  Filled 2020-10-04: qty 1

## 2020-10-04 MED ORDER — ACETAMINOPHEN 325 MG PO TABS
650.0000 mg | ORAL_TABLET | Freq: Four times a day (QID) | ORAL | Status: DC | PRN
  Administered 2020-10-04: 650 mg via ORAL
  Filled 2020-10-04: qty 2

## 2020-10-04 MED ORDER — SODIUM CHLORIDE 0.9% FLUSH
3.0000 mL | Freq: Two times a day (BID) | INTRAVENOUS | Status: DC
Start: 2020-10-04 — End: 2020-10-04
  Administered 2020-10-04: 3 mL via INTRAVENOUS

## 2020-10-04 MED ORDER — METOPROLOL SUCCINATE ER 25 MG PO TB24
25.0000 mg | ORAL_TABLET | Freq: Every day | ORAL | Status: DC
  Administered 2020-10-04 – 2020-10-05 (×2): 25 mg via ORAL
  Filled 2020-10-04 (×2): qty 1

## 2020-10-04 MED ORDER — SODIUM CHLORIDE 0.9 % WEIGHT BASED INFUSION: 1.0000 mL/kg/h | INTRAVENOUS | Status: DC

## 2020-10-04 MED ORDER — HEPARIN (PORCINE) IN NACL 1000-0.9 UT/500ML-% IV SOLN
INTRAVENOUS | Status: AC
  Filled 2020-10-04: qty 1000

## 2020-10-04 MED ORDER — SODIUM CHLORIDE 0.9 % WEIGHT BASED INFUSION
1.0000 mL/kg/h | INTRAVENOUS | Status: DC
  Administered 2020-10-04: 1 mL/kg/h via INTRAVENOUS

## 2020-10-04 MED ORDER — FENTANYL CITRATE (PF) 100 MCG/2ML IJ SOLN
INTRAMUSCULAR | Status: DC | PRN
  Administered 2020-10-04: 25 ug via INTRAVENOUS

## 2020-10-04 MED ORDER — MIDAZOLAM HCL 2 MG/2ML IJ SOLN
INTRAMUSCULAR | Status: AC
  Filled 2020-10-04: qty 2

## 2020-10-04 MED ORDER — SODIUM CHLORIDE 0.9 % IV SOLN: INTRAVENOUS | Status: AC

## 2020-10-04 MED ORDER — SODIUM CHLORIDE 0.9% FLUSH: 3.0000 mL | Freq: Two times a day (BID) | INTRAVENOUS | Status: DC

## 2020-10-04 MED ORDER — FENTANYL CITRATE (PF) 100 MCG/2ML IJ SOLN
INTRAMUSCULAR | Status: AC
  Filled 2020-10-04: qty 2

## 2020-10-04 SURGICAL SUPPLY — 7 items
CATH 5FR JL3.5 JR4 ANG PIG MP (CATHETERS) ×2 IMPLANT
DEVICE RAD COMP TR BAND LRG (VASCULAR PRODUCTS) ×2 IMPLANT
GLIDESHEATH SLEND SS 6F .021 (SHEATH) ×2 IMPLANT
KIT HEART LEFT (KITS) ×2 IMPLANT
SYR MEDRAD MARK 7 150ML (SYRINGE) IMPLANT
TRANSDUCER W/STOPCOCK (MISCELLANEOUS) ×2 IMPLANT
WIRE EMERALD 3MM-J .035X260CM (WIRE) ×2 IMPLANT

## 2020-10-04 NOTE — Progress Notes (Signed)
Pt on Eliquis PTA for Afib and IV Heparin pre cardiac cath today. Provider on call Cecilie Kicks paged to clarify if heparin needed to be restarted or Eliquis. Will restart Eliquis in the AM. Jessie Foot, RN

## 2020-10-04 NOTE — Progress Notes (Signed)
ANTICOAGULATION CONSULT NOTE  Pharmacy Consult for heparin Indication: chest pain/ACS  Allergies  Allergen Reactions   Meperidine Other (See Comments)    Sweating    Vicodin [Hydrocodone-Acetaminophen] Nausea And Vomiting    Patient Measurements: Height: 6' (182.9 cm) Weight: 88.8 kg (195 lb 12.8 oz) IBW/kg (Calculated) : 77.6 Heparin Dosing Weight: 88 kg   Vital Signs: Temp: 98.2 F (36.8 C) (09/19 1039) Temp Source: Oral (09/19 1039) BP: 111/71 (09/19 1039) Pulse Rate: 58 (09/19 1039)  Labs: Recent Labs    10/03/20 1239 10/03/20 1439 10/04/20 0155 10/04/20 0936  HGB 13.7  --  12.9*  --   HCT 40.9  --  37.2*  --   PLT 159  --  130*  --   APTT  --   --  45* 54*  LABPROT  --   --  16.3*  --   INR  --   --  1.3*  --   HEPARINUNFRC  --   --  >1.10*  --   CREATININE 0.94  --  0.79  --   TROPONINIHS 20* 80*  --  1,175*     Estimated Creatinine Clearance: 95.7 mL/min (by C-G formula based on SCr of 0.79 mg/dL).   Assessment: 45 YOM who presents with chest pain with mildly elevated troponins that are trending up. Pharmacy consulted to dose IV heparin for ACS. Of note, patient is on Eliquis at home for Afib and last dose was 9/18 in the morning) Plans noted for cath today -aPTT= 54    Goal of Therapy:  Heparin level 0.3-0.7 units/ml aPTT 66-102 seconds Monitor platelets by anticoagulation protocol: Yes   Plan:  -Increase heparin to 1600 units/hr -Will follow plans post cath  Hildred Laser, PharmD Clinical Pharmacist **Pharmacist phone directory can now be found on [email or web address] ([email or web address]).  Listed under [place].

## 2020-10-04 NOTE — Progress Notes (Addendum)
Progress Note  Patient Name: Benjamin Griffin Date of Encounter: 10/04/2020  [facility] Cardiologist: Shirlee More, MD   Subjective   Admitted overnight for NSTEMI after presenting with chest pain. Patient was digging a hole for her daughter's dog who died yesterday morning around 7am. No chest pain during this but developed left sided chest pain after this around 9am that persisted. At times radiated to his neck. Currently chest pain free. He does not some left sided chest discomfort if he turns his head a certain way (sounds more musculoskeletal). No shortness of breath.  Inpatient Medications    Scheduled Meds:  amLODipine  5 mg Oral Daily   aspirin EC  81 mg Oral Daily   atorvastatin  80 mg Oral Daily   docusate sodium  100 mg Oral BID   fenofibrate  160 mg Oral Daily   [START ON 10/05/2020] influenza vaccine adjuvanted  0.5 mL Intramuscular Once   metoprolol succinate  25 mg Oral Daily   Continuous Infusions:  heparin 1,400 Units/hr (10/04/20 0513)   PRN Meds: acetaminophen, nitroGLYCERIN, ondansetron (ZOFRAN) IV   Vital Signs    Vitals:   10/03/20 1730 10/03/20 1856 10/03/20 2024 10/04/20 0509  BP: 124/70  (!) 163/86 105/73  Pulse: (!) 56  (!) 59   Resp: '14  14 14  '$ Temp:   97.7 F (36.5 C) 98 F (36.7 C)  TempSrc:   Oral Oral  SpO2: 99%  98% 97%  Weight:  89.3 kg 88.8 kg   Height:  6' (1.829 m) 6' (1.829 m)     Intake/Output Summary (Last 24 hours) at 10/04/2020 0805 Last data filed at 10/04/2020 0300 Gross per 24 hour  Intake 322.62 ml  Output --  Net 322.62 ml   Last 3 Weights 10/03/2020 10/03/2020 08/25/2020  Weight (lbs) 195 lb 12.8 oz 196 lb 13.9 oz 195 lb 9.6 oz  Weight (kg) 88.814 kg 89.3 kg 88.724 kg      Telemetry    Sinus bradycardia with rates in the high 40s to 50s. - Personally Reviewed  ECG   No new ECG tracing today. ECGs from yesterday show normal sinus rhythm with no acute ST/T changes. - Personally Reviewed  Physical Exam   GEN:  No acute distress.   Neck: No JVD. Cardiac: Bradycardic with normal rhythm. No murmurs, rubs, or gallops. Radial pulses 2+ and equal bilaterally. Respiratory: Clear to auscultation bilaterally. No wheezes, rhonchi, or rales. GI: Soft, non-distended, and non-tender.  MS: No lower extremity edema. No deformity. Skin: Warm and dry. Neuro:  No focal deficits. Psych: Normal affect. Responds appropriately.  Labs    High Sensitivity Troponin:   Recent Labs  Lab 10/03/20 1239 10/03/20 1439  TROPONINIHS 20* 80*     Chemistry Recent Labs  Lab 10/03/20 1239 10/04/20 0155  NA 139 138  K 3.9 3.9  CL 108 107  CO2 22 24  GLUCOSE 114* 95  BUN 14 12  CREATININE 0.94 0.79  CALCIUM 9.6 9.4  PROT 7.5  --   ALBUMIN 4.2  --   AST 21  --   ALT 21  --   ALKPHOS 67  --   BILITOT 0.7  --   GFRNONAA >60 >60  ANIONGAP 9 7    Lipids No results for input(s): CHOL, TRIG, HDL, LABVLDL, LDLCALC, CHOLHDL in the last 168 hours.  Hematology Recent Labs  Lab 10/03/20 1239 10/04/20 0155  WBC 5.6 5.5  RBC 4.45 4.11*  HGB 13.7 12.9*  HCT 40.9 37.2*  MCV 91.9 90.5  MCH 30.8 31.4  MCHC 33.5 34.7  RDW 13.4 13.3  PLT 159 130*   Thyroid No results for input(s): TSH, FREET4 in the last 168 hours.  BNPNo results for input(s): BNP, PROBNP in the last 168 hours.  DDimer No results for input(s): DDIMER in the last 168 hours.   Radiology    DG Chest 2 View  Result Date: 10/03/2020 CLINICAL DATA:  Chest pain. EXAM: CHEST - 2 VIEW COMPARISON:  January 06, 2020 FINDINGS: The heart size and mediastinal contours are within normal limits. Both lungs are clear. The visualized skeletal structures are unremarkable. IMPRESSION: No active cardiopulmonary disease. Electronically Signed   By: Dorise Bullion III M.D.   On: 10/03/2020 13:31    Cardiac Studies   Echo pending.  Patient Profile     70 y.o. male with a history of paroxysmal atrial fibrillation on Eliquis, hypertension, and dyslipidemia who was  admitted on 10/03/2020 with NSTEMI after presenting with chest pain.  Assessment & Plan    NSTEMI - Presented with chest pain after digging a hole for his daughter's dog who died. - High-sensitivity troponin 20 >> 80. Will repeat. - EKG showed normal sinus rhythm with no acute ST/T changes. - Echo pending.  - Currently chest pain free. - Continue IV Heparin.  - Continue aspirin, beta-blocker, and statin. - Will discuss cardiac catheterization with MD. Did go ahead and discuss risks/benefits with patient and he agrees to proceed if this is what MD recommends.  Paroxysmal Atrial Fibrillation - Maintaining sinus rhythm. Rates in the high 40s to 50s. - On Toprol-XL '50mg'$  daily. Will decrease to '25mg'$  daily given bradycardia. - Home Flecainide stopped due to NSTEMI. - On chronic anticoagulation with Eliquis '5mg'$  twice daily at home. Held on admission and started on IV Heparin in anticipation for possible cardiac cath. Last dose morning of 9/18.  Hypertension - BP intermittently elevated. - Continue home Amlodipine '5mg'$  daily and Toprol-XL '50mg'$  daily.  Dyslipidemia - No lipid panel in our system. - On Simvastatin '20mg'$  daily at home. Will go ahead and transition to Lipitor '80mg'$  in favor of high-intensity statin.  - Will check fasting lipid panel.  For questions or updates, please contact [place] Please consult [email or web address] for contact info under        Signed, Darreld Mclean, PA-C  10/04/2020, 8:05 AM    ADDENDUM: Repeat high-intensity troponin came back at 1,175. Per RN, patient still chest pain free. Continue IV Heparin. Will plan for cardiac catheterization later today. The patient understands that risks include but are not limited to stroke (1 in 1000), death (1 in 58), kidney failure [usually temporary] (1 in 500), bleeding (1 in 200), allergic reaction [possibly serious] (1 in 200), and agrees to proceed.   Darreld Mclean, PA-C 10/04/2020 11:12 AM

## 2020-10-04 NOTE — Progress Notes (Signed)
Date and time results received: 10/04/20 @ 1058   Test: Troponin Critical Value: 1175  Name of Provider Notified: Sande Rives  Orders Received : yes  Pt will be going for Cardiac cath today

## 2020-10-04 NOTE — Progress Notes (Addendum)
Pt concerned home medication Flecainide was not ordered. Provider on call Dr Rodman Key paged. Per MD medication will be held pending further workup. Jessie Foot, RN

## 2020-10-04 NOTE — Progress Notes (Signed)
[facility] for heparin Indication: chest pain/ACS  Allergies  Allergen Reactions   Meperidine Other (See Comments)    Sweating    Vicodin [Hydrocodone-Acetaminophen] Nausea And Vomiting    Patient Measurements: Height: 6' (182.9 cm) Weight: 88.8 kg (195 lb 12.8 oz) IBW/kg (Calculated) : 77.6 Heparin Dosing Weight: 88 kg   Vital Signs: Temp: 97.7 F (36.5 C) (09/18 2024) Temp Source: Oral (09/18 2024) BP: 163/86 (09/18 2024) Pulse Rate: 59 (09/18 2024)  Labs: Recent Labs    10/03/20 1239 10/03/20 1439 10/04/20 0155  HGB 13.7  --  12.9*  HCT 40.9  --  37.2*  PLT 159  --  130*  APTT  --   --  45*  LABPROT  --   --  16.3*  INR  --   --  1.3*  HEPARINUNFRC  --   --  >1.10*  CREATININE 0.94  --  0.79  TROPONINIHS 20* 80*  --      Estimated Creatinine Clearance: 95.7 mL/min (by C-G formula based on SCr of 0.79 mg/dL).   Assessment: 72 YOM who presents with chest pain with mildly elevated troponins that are trending up. Pharmacy consulted to start IV heparin for ACS. Of note, patient is on Eliquis at home for Afib and last dose was this AM.   Heparin level >1.1 (affected by apixaban), aPTT 45 sec (subtherapeutic on gtt at 1150 units/hr). No issues with line or bleeding reported per RN. Noted plan for cath.  Goal of Therapy:  Heparin level 0.3-0.7 units/ml aPTT 66-102 seconds Monitor platelets by anticoagulation protocol: Yes   Plan:  Increase heparin to 1400 units/hr  F/u 6 hr heparin level  Sherlon Handing, PharmD, BCPS Please see amion for complete clinical pharmacist phone list 10/04/2020 3:42 AM

## 2020-10-04 NOTE — Progress Notes (Signed)
  Echocardiogram 2D Echocardiogram has been performed.  Benjamin Griffin 10/04/2020, 9:23 AM

## 2020-10-04 NOTE — Interval H&P Note (Signed)
History and Physical Interval Note:  10/04/2020 4:53 PM  Benjamin Griffin  has presented today for surgery, with the diagnosis of chest pain.  The various methods of treatment have been discussed with the patient and family. After consideration of risks, benefits and other options for treatment, the patient has consented to  Procedure(s): LEFT HEART CATH AND CORONARY ANGIOGRAPHY (N/A) as a surgical intervention.  The patient's history has been reviewed, patient examined, no change in status, stable for surgery.  I have reviewed the patient's chart and labs.  Questions were answered to the patient's satisfaction.    Cath Lab Visit (complete for each Cath Lab visit)  Clinical Evaluation Leading to the Procedure:   ACS: Yes.    Non-ACS:    Anginal Classification: CCS II  Anti-ischemic medical therapy: Maximal Therapy (2 or more classes of medications)  Non-Invasive Test Results: No non-invasive testing performed  Prior CABG: No previous CABG        Early [place]

## 2020-10-04 NOTE — H&P (View-Only) (Signed)
Progress Note  Patient Name: Benjamin Griffin Date of Encounter: 10/04/2020  [facility] Cardiologist: Shirlee More, MD   Subjective   Admitted overnight for NSTEMI after presenting with chest pain. Patient was digging a hole for her daughter's dog who died yesterday morning around 7am. No chest pain during this but developed left sided chest pain after this around 9am that persisted. At times radiated to his neck. Currently chest pain free. He does not some left sided chest discomfort if he turns his head a certain way (sounds more musculoskeletal). No shortness of breath.  Inpatient Medications    Scheduled Meds:  amLODipine  5 mg Oral Daily   aspirin EC  81 mg Oral Daily   atorvastatin  80 mg Oral Daily   docusate sodium  100 mg Oral BID   fenofibrate  160 mg Oral Daily   [START ON 10/05/2020] influenza vaccine adjuvanted  0.5 mL Intramuscular Once   metoprolol succinate  25 mg Oral Daily   Continuous Infusions:  heparin 1,400 Units/hr (10/04/20 0513)   PRN Meds: acetaminophen, nitroGLYCERIN, ondansetron (ZOFRAN) IV   Vital Signs    Vitals:   10/03/20 1730 10/03/20 1856 10/03/20 2024 10/04/20 0509  BP: 124/70  (!) 163/86 105/73  Pulse: (!) 56  (!) 59   Resp: '14  14 14  '$ Temp:   97.7 F (36.5 C) 98 F (36.7 C)  TempSrc:   Oral Oral  SpO2: 99%  98% 97%  Weight:  89.3 kg 88.8 kg   Height:  6' (1.829 m) 6' (1.829 m)     Intake/Output Summary (Last 24 hours) at 10/04/2020 0805 Last data filed at 10/04/2020 0300 Gross per 24 hour  Intake 322.62 ml  Output --  Net 322.62 ml   Last 3 Weights 10/03/2020 10/03/2020 08/25/2020  Weight (lbs) 195 lb 12.8 oz 196 lb 13.9 oz 195 lb 9.6 oz  Weight (kg) 88.814 kg 89.3 kg 88.724 kg      Telemetry    Sinus bradycardia with rates in the high 40s to 50s. - Personally Reviewed  ECG   No new ECG tracing today. ECGs from yesterday show normal sinus rhythm with no acute ST/T changes. - Personally Reviewed  Physical Exam   GEN:  No acute distress.   Neck: No JVD. Cardiac: Bradycardic with normal rhythm. No murmurs, rubs, or gallops. Radial pulses 2+ and equal bilaterally. Respiratory: Clear to auscultation bilaterally. No wheezes, rhonchi, or rales. GI: Soft, non-distended, and non-tender.  MS: No lower extremity edema. No deformity. Skin: Warm and dry. Neuro:  No focal deficits. Psych: Normal affect. Responds appropriately.  Labs    High Sensitivity Troponin:   Recent Labs  Lab 10/03/20 1239 10/03/20 1439  TROPONINIHS 20* 80*     Chemistry Recent Labs  Lab 10/03/20 1239 10/04/20 0155  NA 139 138  K 3.9 3.9  CL 108 107  CO2 22 24  GLUCOSE 114* 95  BUN 14 12  CREATININE 0.94 0.79  CALCIUM 9.6 9.4  PROT 7.5  --   ALBUMIN 4.2  --   AST 21  --   ALT 21  --   ALKPHOS 67  --   BILITOT 0.7  --   GFRNONAA >60 >60  ANIONGAP 9 7    Lipids No results for input(s): CHOL, TRIG, HDL, LABVLDL, LDLCALC, CHOLHDL in the last 168 hours.  Hematology Recent Labs  Lab 10/03/20 1239 10/04/20 0155  WBC 5.6 5.5  RBC 4.45 4.11*  HGB 13.7 12.9*  HCT 40.9 37.2*  MCV 91.9 90.5  MCH 30.8 31.4  MCHC 33.5 34.7  RDW 13.4 13.3  PLT 159 130*   Thyroid No results for input(s): TSH, FREET4 in the last 168 hours.  BNPNo results for input(s): BNP, PROBNP in the last 168 hours.  DDimer No results for input(s): DDIMER in the last 168 hours.   Radiology    DG Chest 2 View  Result Date: 10/03/2020 CLINICAL DATA:  Chest pain. EXAM: CHEST - 2 VIEW COMPARISON:  January 06, 2020 FINDINGS: The heart size and mediastinal contours are within normal limits. Both lungs are clear. The visualized skeletal structures are unremarkable. IMPRESSION: No active cardiopulmonary disease. Electronically Signed   By: Dorise Bullion III M.D.   On: 10/03/2020 13:31    Cardiac Studies   Echo pending.  Patient Profile     70 y.o. male with a history of paroxysmal atrial fibrillation on Eliquis, hypertension, and dyslipidemia who was  admitted on 10/03/2020 with NSTEMI after presenting with chest pain.  Assessment & Plan    NSTEMI - Presented with chest pain after digging a hole for his daughter's dog who died. - High-sensitivity troponin 20 >> 80. Will repeat. - EKG showed normal sinus rhythm with no acute ST/T changes. - Echo pending.  - Currently chest pain free. - Continue IV Heparin.  - Continue aspirin, beta-blocker, and statin. - Will discuss cardiac catheterization with MD. Did go ahead and discuss risks/benefits with patient and he agrees to proceed if this is what MD recommends.  Paroxysmal Atrial Fibrillation - Maintaining sinus rhythm. Rates in the high 40s to 50s. - On Toprol-XL '50mg'$  daily. Will decrease to '25mg'$  daily given bradycardia. - Home Flecainide stopped due to NSTEMI. - On chronic anticoagulation with Eliquis '5mg'$  twice daily at home. Held on admission and started on IV Heparin in anticipation for possible cardiac cath. Last dose morning of 9/18.  Hypertension - BP intermittently elevated. - Continue home Amlodipine '5mg'$  daily and Toprol-XL '50mg'$  daily.  Dyslipidemia - No lipid panel in our system. - On Simvastatin '20mg'$  daily at home. Will go ahead and transition to Lipitor '80mg'$  in favor of high-intensity statin.  - Will check fasting lipid panel.  For questions or updates, please contact [place] Please consult [email or web address] for contact info under        Signed, Darreld Mclean, PA-C  10/04/2020, 8:05 AM    ADDENDUM: Repeat high-intensity troponin came back at 1,175. Per RN, patient still chest pain free. Continue IV Heparin. Will plan for cardiac catheterization later today. The patient understands that risks include but are not limited to stroke (1 in 1000), death (1 in 60), kidney failure [usually temporary] (1 in 500), bleeding (1 in 200), allergic reaction [possibly serious] (1 in 200), and agrees to proceed.   Darreld Mclean, PA-C 10/04/2020 11:12 AM

## 2020-10-04 NOTE — Plan of Care (Signed)
  Problem: Clinical Measurements: Goal: Ability to maintain clinical measurements within normal limits will improve Outcome: Progressing   Problem: Coping: Goal: Level of anxiety will decrease Outcome: Completed/Met   Problem: Elimination: Goal: Will not experience complications related to urinary retention Outcome: Completed/Met   Problem: Pain Managment: Goal: General experience of comfort will improve Outcome: Completed/Met

## 2020-10-05 DIAGNOSIS — I25110 Atherosclerotic heart disease of native coronary artery with unstable angina pectoris: Secondary | ICD-10-CM

## 2020-10-05 DIAGNOSIS — I2510 Atherosclerotic heart disease of native coronary artery without angina pectoris: Secondary | ICD-10-CM

## 2020-10-05 DIAGNOSIS — E785 Hyperlipidemia, unspecified: Secondary | ICD-10-CM | POA: Diagnosis not present

## 2020-10-05 DIAGNOSIS — I4891 Unspecified atrial fibrillation: Secondary | ICD-10-CM | POA: Diagnosis not present

## 2020-10-05 DIAGNOSIS — I214 Non-ST elevation (NSTEMI) myocardial infarction: Secondary | ICD-10-CM | POA: Diagnosis not present

## 2020-10-05 HISTORY — DX: Atherosclerotic heart disease of native coronary artery without angina pectoris: I25.10

## 2020-10-05 LAB — BASIC METABOLIC PANEL
Anion gap: 9 (ref 5–15)
BUN: 13 mg/dL (ref 8–23)
CO2: 21 mmol/L — ABNORMAL LOW (ref 22–32)
Calcium: 9.2 mg/dL (ref 8.9–10.3)
Chloride: 105 mmol/L (ref 98–111)
Creatinine, Ser: 0.95 mg/dL (ref 0.61–1.24)
GFR, Estimated: >60 mL/min (ref >60)
Glucose, Bld: 132 mg/dL — ABNORMAL HIGH (ref 70–99)
Potassium: 3.7 mmol/L (ref 3.5–5.1)
Sodium: 135 mmol/L (ref 135–145)

## 2020-10-05 LAB — CBC
HCT: 36.7 % — ABNORMAL LOW (ref 39.0–52.0)
Hemoglobin: 12.4 g/dL — ABNORMAL LOW (ref 13.0–17.0)
MCH: 30.5 pg (ref 26.0–34.0)
MCHC: 33.8 g/dL (ref 30.0–36.0)
MCV: 90.4 fL (ref 80.0–100.0)
Platelets: 125 10*3/uL — ABNORMAL LOW (ref 150–400)
RBC: 4.06 MIL/uL — ABNORMAL LOW (ref 4.22–5.81)
RDW: 13.2 % (ref 11.5–15.5)
WBC: 5 10*3/uL (ref 4.0–10.5)
nRBC: 0 % (ref 0.0–0.2)

## 2020-10-05 LAB — TROPONIN I (HIGH SENSITIVITY): Troponin I (High Sensitivity): 547 ng/L (ref <18)

## 2020-10-05 MED ORDER — NITROGLYCERIN 0.4 MG SL SUBL: 0.4000 mg | SUBLINGUAL_TABLET | SUBLINGUAL | 2 refills | Status: DC | PRN

## 2020-10-05 MED ORDER — METOPROLOL SUCCINATE ER 50 MG PO TB24: 25.0000 mg | ORAL_TABLET | Freq: Every day | ORAL | Status: DC

## 2020-10-05 MED ORDER — ASPIRIN EC 81 MG PO TBEC: 81.0000 mg | DELAYED_RELEASE_TABLET | Freq: Every day | ORAL | Status: DC

## 2020-10-05 NOTE — Progress Notes (Signed)
CARDIAC REHAB PHASE I   PRE:  Rate/Rhythm: 68 SR  BP:  Supine:   Sitting: 136/68  Standing:    SaO2: 97%RA  MODE:  Ambulation: 800 ft   POST:  Rate/Rhythm: 75 SR  BP:  Supine:   Sitting: 170/77  Standing:    SaO2: 98%RA 0858-1000 Pt walked 800 ft on RA with steady gait and tolerated well. MI education completed with pt who voiced understanding. Reviewed MI restrictions, walking for ex, heart healthy food choices, NTG use, and CRP 2. Referral letter to be sent to [facility].    Graylon Good, RN BSN  10/05/2020 10:09 AM

## 2020-10-05 NOTE — Discharge Summary (Signed)
Discharge Summary    Patient ID: Benjamin Griffin MRN: [id number]; DOB: [birth year]  Admit date: 10/03/2020 Discharge date: 10/05/2020  PCP:  Ronita Hipps, MD   [facility] Providers Cardiologist:  Shirlee More, MD   Discharge Diagnoses    Principal Problem:   NSTEMI (non-ST elevated myocardial infarction) [facility]) Active Problems:   Mild non-obstructive CAD   Atrial fibrillation with rapid ventricular response ([place])   Hypertension   Dyslipidemia    Diagnostic Studies/Procedures    Echocardiogram 10/04/2020: Impressions:  1. Left ventricular ejection fraction, by estimation, is 70 to 75%. The  left ventricle has hyperdynamic function. The left ventricle has no  regional wall motion abnormalities. The left ventricular internal cavity  size was mildly dilated. There is mild  left ventricular hypertrophy. Left ventricular diastolic parameters were  normal.   2. Right ventricular systolic function is normal. The right ventricular  size is normal. There is normal pulmonary artery systolic pressure.   3. The mitral valve is normal in structure. Trivial mitral valve  regurgitation.   4. The aortic valve is tricuspid. Aortic valve regurgitation is moderate.   5. The inferior vena cava is normal in size with greater than 50%  respiratory variability, suggesting right atrial pressure of 3 mmHg.  _____________  Left Cardiac Catheterization 10/04/2020:   Prox LAD lesion is 15% stenosed.   Prox RCA lesion is 20% stenosed.   LV end diastolic pressure is low.    Mild obstructive coronary artery disease involving the proximal LAD and RCA. Low LVEDP.   Recommendation:  Medical therapy.  Diagnostic Dominance: Right    History of Present Illness     Benjamin Griffin is a 70 y.o. male with a history of paroxysmal atrial fibrillation on Eliquis, hypertension, and dyslipidemia who was admitted on 10/03/2020 with NSTEMI after presenting with chest pain.  Patient reported chest pain  began around 9am on the morning of presentation after he had been digging a hole for his daughter's dog who had died 2 hours before. He had no symptoms during this activity but about 2 hours later develop left sided chest pain that radiated to the left side of his neck. The pain was constant and he ranked it has a 5/10 on the pain scale. No associated diaphoresis, shortness of breath, nausea/vomiting, syncope, leg pain/swelling.   In the ED, EKG showed no acute ischemic changes. High-sensitivity troponin 20 >> 80. He was started on IV Heparin and admitted for further work-up of NSTEMI.  Hospital Course     Consultants: None  NSTEMI Patient admitted with NSTEMI as stated above. High-sensitivity troponin elevated at 20 >> 80 >> 1,175 >> 547. Echo showed LVEF of 70-75% with normal wall motion, normal diastolic function, and no significant valvular disease. Patient underwent left cardiac catheterization which showed only mild CAD with 15% stenosis of proximal LAD and 20% stenosis of proximal RCA. Unclear what caused such a high troponin. Given we have no prior cardiac catheterization for comparison, it is possible that he had occlusion of small branch vessel that we were unable to see on cath. Symptoms not consistent with myopericarditis or PE. Continued medical therapy was recommended. Will start Aspirin 81mg  daily. Continue beta-blocker and statin.  Paroxysmal Atrial Fibrillation Maintained sinus rhythm throughout admission. Home Toprol-XL was decreased to 25mg  daily given rates in the 40s to 50s. Flecainide was initially held on admission due to concern for ACS but will restart home dose of 50mg  twice daily at discharge given only minimal CAD. Eliquis  was restarted on morning of discharge - continue 5mg  twice daily.   Hypertension BP intermittently elevated during admission but reasonably well controlled. Continue home Amlodipine 5mg  daily. Toprol-XL decreased to 25mg  daily given bradycardia.     Dyslipidemia Lipid panel this admission: Total Cholesterol 107, Triglycerides 89, HDL 32, LDL 57. LDL goal <70 given CAD. Can continue home Simvastatin 20mg  daily given minimal CAD and LDL in the 50s.  Patient seen and examined today by Dr. Debara Pickett and determined to be stable for discharge. Outpatient follow-up arranged. Medications as below.   Did the patient have an acute coronary syndrome (MI, NSTEMI, STEMI, etc) this admission?:  Yes                               AHA/ACC Clinical Performance & Quality Measures: Aspirin prescribed? - Yes ADP Receptor Inhibitor (Plavix/Clopidogrel, Brilinta/Ticagrelor or Effient/Prasugrel) prescribed (includes medically managed patients)? - No - only minimal CAD on cath and also on Eliquis for atrial fibrillation Beta Blocker prescribed? - Yes High Intensity Statin (Lipitor 40-80mg  or Crestor 20-40mg ) prescribed? No - only minimal CAD on cath and LDL in the 50s on home Simvastatin EF assessed during THIS hospitalization? - Yes For EF <40%, was ACEI/ARB prescribed? - Not Applicable (EF >/= 96%) For EF <40%, Aldosterone Antagonist (Spironolactone or Eplerenone) prescribed? - Not Applicable (EF >/= 28%) Cardiac Rehab Phase II ordered (including medically managed patients)? - Yes      _____________  Discharge Vitals Blood pressure 132/73, pulse 64, temperature 98 F (36.7 C), temperature source Oral, resp. rate 17, height 6' (1.829 m), weight 86.2 kg, SpO2 98 %.  Filed Weights   10/03/20 1856 10/03/20 2024 10/04/20 1555  Weight: 89.3 kg 88.8 kg 86.2 kg   General: 70 y.o. male resting comfortably in no acute distress. HEENT: Normocephalic and atraumatic. Sclera clear.  Neck: Supple. No JVD. Heart: Borderline bradycardic with normal rhythm. Distinct S1 and S2. No murmurs, gallops, or rubs. Radial pulses 2+ and equal bilaterally. Right radial cath site soft and stable with no signs of hematoma. Lungs: No increased work of breathing. Clear to ausculation  bilaterally. No wheezes, rhonchi, or rales.  Abdomen: Soft, non-distended, and non-tender to palpation.  Extremities: No lower extremity edema.    Skin: Warm and dry. Neuro: Alert and oriented x3. No focal deficits. Psych: Normal affect. Responds appropriately.  Labs & Radiologic Studies    CBC Recent Labs    10/04/20 0155 10/05/20 0209  WBC 5.5 5.0  HGB 12.9* 12.4*  HCT 37.2* 36.7*  MCV 90.5 90.4  PLT 130* 366*   Basic Metabolic Panel Recent Labs    10/04/20 0155 10/05/20 0209  NA 138 135  K 3.9 3.7  CL 107 105  CO2 24 21*  GLUCOSE 95 132*  BUN 12 13  CREATININE 0.79 0.95  CALCIUM 9.4 9.2   Liver Function Tests Recent Labs    10/03/20 1239  AST 21  ALT 21  ALKPHOS 67  BILITOT 0.7  PROT 7.5  ALBUMIN 4.2   No results for input(s): LIPASE, AMYLASE in the last 72 hours. High Sensitivity Troponin:   Recent Labs  Lab 10/03/20 1239 10/03/20 1439 10/04/20 0936 10/05/20 1408  TROPONINIHS 20* 80* 1,175* 547*    BNP Invalid input(s): POCBNP D-Dimer No results for input(s): DDIMER in the last 72 hours. Hemoglobin A1C Recent Labs    10/04/20 0936  HGBA1C 5.0   Fasting Lipid Panel Recent Labs  10/04/20 0936  CHOL 107  HDL 32*  LDLCALC 57  TRIG 89  CHOLHDL 3.3   Thyroid Function Tests No results for input(s): TSH, T4TOTAL, T3FREE, THYROIDAB in the last 72 hours.  Invalid input(s): FREET3 _____________  DG Chest 2 View  Result Date: 10/03/2020 CLINICAL DATA:  Chest pain. EXAM: CHEST - 2 VIEW COMPARISON:  January 06, 2020 FINDINGS: The heart size and mediastinal contours are within normal limits. Both lungs are clear. The visualized skeletal structures are unremarkable. IMPRESSION: No active cardiopulmonary disease. Electronically Signed   By: Dorise Bullion III M.D.   On: 10/03/2020 13:31   CARDIAC CATHETERIZATION  Result Date: 10/04/2020   Prox LAD lesion is 15% stenosed.   Prox RCA lesion is 20% stenosed.   LV end diastolic pressure is low.   Mild obstructive coronary artery disease involving the proximal LAD and RCA. Low LVEDP. Recommendation:  Medical therapy.   ECHOCARDIOGRAM COMPLETE  Result Date: 10/04/2020    ECHOCARDIOGRAM REPORT   Patient Name:   Benjamin Griffin Date of Exam: 10/04/2020 Medical Rec #:  [id number]      Height:       72.0 in Accession #:    [id number]     Weight:       195.8 lb Date of Birth:  July 13, 1950       BSA:          2.111 m Patient Age:    58 years       BP:           105/73 mmHg Patient Gender: M              HR:           59 bpm. Exam Location:  Inpatient Procedure: 2D Echo, Cardiac Doppler and Color Doppler Indications:    NSTEMI  History:        Patient has no prior history of Echocardiogram examinations.                 Arrythmias:Atrial Fibrillation, Signs/Symptoms:Chest Pain; Risk                 Factors:Hypertension, Former Smoker, Sleep Apnea and                 Dyslipidemia.  Sonographer:    Clayton Lefort RDCS (AE) Referring Phys: [id number] [place]  1. Left ventricular ejection fraction, by estimation, is 70 to 75%. The left ventricle has hyperdynamic function. The left ventricle has no regional wall motion abnormalities. The left ventricular internal cavity size was mildly dilated. There is mild left ventricular hypertrophy. Left ventricular diastolic parameters were normal.  2. Right ventricular systolic function is normal. The right ventricular size is normal. There is normal pulmonary artery systolic pressure.  3. The mitral valve is normal in structure. Trivial mitral valve regurgitation.  4. The aortic valve is tricuspid. Aortic valve regurgitation is moderate.  5. The inferior vena cava is normal in size with greater than 50% respiratory variability, suggesting right atrial pressure of 3 mmHg. FINDINGS  Left Ventricle: Left ventricular ejection fraction, by estimation, is 70 to 75%. The left ventricle has hyperdynamic function. The left ventricle has no regional wall motion abnormalities. The  left ventricular internal cavity size was mildly dilated. There is mild left ventricular hypertrophy. Left ventricular diastolic parameters were normal. Right Ventricle: The right ventricular size is normal. Right vetricular wall thickness was not assessed. Right ventricular systolic function is normal. There is normal pulmonary  artery systolic pressure. The tricuspid regurgitant velocity is 2.64 m/s, and with an assumed right atrial pressure of 3 mmHg, the estimated right ventricular systolic pressure is 84.6 mmHg. Left Atrium: Left atrial size was normal in size. Right Atrium: Right atrial size was normal in size. Pericardium: There is no evidence of pericardial effusion. Mitral Valve: The mitral valve is normal in structure. Trivial mitral valve regurgitation. Tricuspid Valve: The tricuspid valve is normal in structure. Tricuspid valve regurgitation is mild. Aortic Valve: The aortic valve is tricuspid. Aortic valve regurgitation is moderate. Aortic regurgitation PHT measures 526 msec. Aortic valve mean gradient measures 3.0 mmHg. Aortic valve peak gradient measures 7.2 mmHg. Aortic valve area, by VTI measures 3.79 cm. Pulmonic Valve: The pulmonic valve was normal in structure. Pulmonic valve regurgitation is trivial. Aorta: The aortic root and ascending aorta are structurally normal, with no evidence of dilitation. Venous: The inferior vena cava is normal in size with greater than 50% respiratory variability, suggesting right atrial pressure of 3 mmHg. IAS/Shunts: No atrial level shunt detected by color flow Doppler.  LEFT VENTRICLE PLAX 2D LVIDd:         5.50 cm  Diastology LVIDs:         3.30 cm  LV e' medial:    6.31 cm/s LV PW:         1.10 cm  LV E/e' medial:  7.1 LV IVS:        1.20 cm  LV e' lateral:   8.49 cm/s LVOT diam:     2.20 cm  LV E/e' lateral: 5.2 LV SV:         103 LV SV Index:   49 LVOT Area:     3.80 cm  RIGHT VENTRICLE RV Basal diam:  3.50 cm RV S prime:     12.60 cm/s TAPSE (M-mode): 2.6 cm  LEFT ATRIUM             Index       RIGHT ATRIUM           Index LA diam:        4.10 cm 1.94 cm/m  RA Area:     20.90 cm LA Vol (A2C):   61.7 ml 29.22 ml/m RA Volume:   60.20 ml  28.51 ml/m LA Vol (A4C):   49.1 ml 23.25 ml/m LA Biplane Vol: 56.6 ml 26.81 ml/m  AORTIC VALVE AV Area (Vmax):    3.35 cm AV Area (Vmean):   3.50 cm AV Area (VTI):     3.79 cm AV Vmax:           134.00 cm/s AV Vmean:          84.600 cm/s AV VTI:            0.272 m AV Peak Grad:      7.2 mmHg AV Mean Grad:      3.0 mmHg LVOT Vmax:         118.00 cm/s LVOT Vmean:        77.900 cm/s LVOT VTI:          0.271 m LVOT/AV VTI ratio: 1.00 AI PHT:            526 msec  AORTA Ao Root diam: 4.10 cm Ao Asc diam:  3.20 cm MITRAL VALVE               TRICUSPID VALVE MV Area (PHT): 3.24 cm    TR Peak grad:   27.9 mmHg MV  Decel Time: 234 msec    TR Vmax:        264.00 cm/s MV E velocity: 44.50 cm/s MV A velocity: 44.30 cm/s  SHUNTS MV E/A ratio:  1.00        Systemic VTI:  0.27 m                            Systemic Diam: 2.20 cm Dorris Carnes MD Electronically signed by Dorris Carnes MD Signature Date/Time: 10/04/2020/12:54:00 PM    Final    Disposition   Patient is being discharged home today in good condition.  Follow-up Plans & Appointments     Follow-up Information     Richardo Priest, MD Follow up.   Specialties: Cardiology, Radiology Why: Hospital follow-up with Cardiology scheduled for 10/19/2020 at 1:40pm. Please arrive 15 minutes early for check-in. If this date/time does not work for you, please call our office to reschedule. Contact information: [place] [phone]                Discharge Instructions     Amb Referral to Cardiac Rehabilitation   Complete by: As directed    Referring to [facility]   Diagnosis: NSTEMI   After initial evaluation and assessments completed: Virtual Based Care may be provided alone or in conjunction with Phase 2 Cardiac Rehab based on patient barriers.:  Yes   Diet - low sodium heart healthy   Complete by: As directed    Increase activity slowly   Complete by: As directed        Discharge Medications   Allergies as of 10/05/2020       Reactions   Meperidine Other (See Comments)   Sweating    Vicodin [hydrocodone-acetaminophen] Nausea And Vomiting        Medication List     STOP taking these medications    docusate sodium 100 MG capsule Commonly known as: COLACE   oxyCODONE-acetaminophen 5-325 MG tablet Commonly known as: PERCOCET/ROXICET       TAKE these medications    amLODipine 5 MG tablet Commonly known as: NORVASC Take 1 tablet (5 mg total) by mouth daily.   aspirin EC 81 MG tablet Take 1 tablet (81 mg total) by mouth daily. Swallow whole.   cyclobenzaprine 10 MG tablet Commonly known as: FLEXERIL Take 1 tablet (10 mg total) by mouth 3 (three) times daily as needed for muscle spasms.   Eliquis 5 MG Tabs tablet Generic drug: apixaban Take 1 tablet (5 mg total) by mouth 2 (two) times daily.   Fenofibric Acid 135 MG Cpdr Take 135 mg by mouth daily.   flecainide 50 MG tablet Commonly known as: TAMBOCOR Take 1 tablet (50 mg total) by mouth 2 (two) times daily.   metoprolol succinate 50 MG 24 hr tablet Commonly known as: TOPROL-XL Take 0.5 tablets (25 mg total) by mouth daily. What changed: how much to take   nitroGLYCERIN 0.4 MG SL tablet Commonly known as: NITROSTAT Place 1 tablet (0.4 mg total) under the tongue every 5 (five) minutes x 3 doses as needed for chest pain.   simvastatin 20 MG tablet Commonly known as: ZOCOR Take 20 mg by mouth daily.           Outstanding Labs/Studies   N/A  Duration of Discharge Encounter   Greater than 30 minutes including physician time.  Signed, Darreld Mclean, PA-C 10/05/2020, 4:33 PM

## 2020-10-05 NOTE — Discharge Instructions (Addendum)
Medication Changes: - START Aspirin 81mg  daily. You can get this over the counter. - DECREASE Toprol-XL to 25mg  daily. - Otherwise, continue all home medications.  Post NSTEMI: NO HEAVY LIFTING X 2 WEEKS. NO SEXUAL ACTIVITY X 2 WEEKS. NO DRIVING X 1 WEEK. NO SOAKING BATHS, HOT TUBS, POOLS, ETC., X 7 DAYS.   Radial Site Care: Refer to this sheet in the next few weeks. These instructions provide you with information on caring for yourself after your procedure. Your caregiver may also give you more specific instructions. Your treatment has been planned according to current medical practices, but problems sometimes occur. Call your caregiver if you have any problems or questions after your procedure. HOME CARE INSTRUCTIONS You may shower the day after the procedure. Remove the bandage (dressing) and gently wash the site with plain soap and water. Gently pat the site dry.  Do not apply powder or lotion to the site.  Do not submerge the affected site in water for 3 to 5 days.  Inspect the site at least twice daily.  Do not flex or bend the affected arm for 24 hours.  No lifting over 5 pounds (2.3 kg) for 5 days after your procedure.  Do not drive home if you are discharged the same day of the procedure. Have someone else drive you.  What to expect: Any bruising will usually fade within 1 to 2 weeks.  Blood that collects in the tissue (hematoma) may be painful to the touch. It should usually decrease in size and tenderness within 1 to 2 weeks.  SEEK IMMEDIATE MEDICAL CARE IF: You have unusual pain at the radial site.  You have redness, warmth, swelling, or pain at the radial site.  You have drainage (other than a small amount of blood on the dressing).  You have chills.  You have a fever or persistent symptoms for more than 72 hours.  You have a fever and your symptoms suddenly get worse.  Your arm becomes pale, cool, tingly, or numb.  You have heavy bleeding from the site. Hold pressure on  the site.

## 2020-10-08 ENCOUNTER — Telehealth (HOSPITAL_COMMUNITY): Payer: Self-pay

## 2020-10-08 NOTE — Telephone Encounter (Signed)
Per phase I cardiac rehab, fax cardiac rehab referral to [facility] cardiac rehab.

## 2020-10-19 ENCOUNTER — Ambulatory Visit: Payer: PPO | Admitting: Cardiology

## 2020-10-19 ENCOUNTER — Encounter: Payer: Self-pay | Admitting: Cardiology

## 2020-10-19 ENCOUNTER — Other Ambulatory Visit: Payer: Self-pay

## 2020-10-19 VITALS — BP 112/62 | HR 64 | Ht 72.0 in | Wt 197.4 lb

## 2020-10-19 DIAGNOSIS — E782 Mixed hyperlipidemia: Secondary | ICD-10-CM | POA: Diagnosis not present

## 2020-10-19 DIAGNOSIS — Z79899 Other long term (current) drug therapy: Secondary | ICD-10-CM

## 2020-10-19 DIAGNOSIS — I25110 Atherosclerotic heart disease of native coronary artery with unstable angina pectoris: Secondary | ICD-10-CM

## 2020-10-19 DIAGNOSIS — I110 Hypertensive heart disease with heart failure: Secondary | ICD-10-CM

## 2020-10-19 DIAGNOSIS — I480 Paroxysmal atrial fibrillation: Secondary | ICD-10-CM | POA: Diagnosis not present

## 2020-10-19 DIAGNOSIS — Z7901 Long term (current) use of anticoagulants: Secondary | ICD-10-CM | POA: Diagnosis not present

## 2020-10-19 DIAGNOSIS — Z23 Encounter for immunization: Secondary | ICD-10-CM | POA: Diagnosis not present

## 2020-10-19 NOTE — Progress Notes (Signed)
Cardiology Office Note:    Date:  10/19/2020   ID:  Benjamin Griffin, DOB 1950-06-27, MRN [id number]  PCP:  Ronita Hipps, MD  Cardiologist:  Shirlee More, MD    Referring MD: Ronita Hipps, MD    ASSESSMENT:    1. Paroxysmal atrial fibrillation (HCC)   2. Chronic anticoagulation   3. High risk medication use   4. Hypertensive heart disease with heart failure ([place])   5. Mixed hyperlipidemia   6. Coronary artery disease involving native coronary artery of native heart with unstable angina pectoris ([place])    PLAN:    In order of problems listed above:  Stable no recurrence he has no significant CAD I think flecainide is appropriate we will continue along with anticoagulant.  We will continue to screen his heart rhythm at home and send strips to me MyChart if he has recurrent atrial fibrillation BP at target continue current treatment including beta-blocker and on rate slowing calcium channel blocker Stable continue statin He has very mild coronary atherosclerosis, unfortunately high-sensitivity troponin could be elevated without necrosis his total was less than 100 and I told him that regardless he had no significant cardiac injury his prognosis is good and he is a prescription for nitroglycerin to take as needed.  If he is having typical angina I would intensify his medical therapy adding ranolazine   Next appointment: 6 months   Medication Adjustments/Labs and Tests Ordered: Current medicines are reviewed at length with the patient today.  Concerns regarding medicines are outlined above.  No orders of the defined types were placed in this encounter.  No orders of the defined types were placed in this encounter.   Chief Complaint  Patient presents with   Follow-up    Recent chest pain elevated high-sensitivity troponin called myocardial infarction.  No obstructive CAD     History of Present Illness:    Benjamin Griffin is a 70 y.o. male with a hx of paroxysmal atrial  fibrillation anticoagulated hypertensive heart disease dyslipidemia most recently non-ST elevation MI in 10/03/2020 admitted to [facility] and discharged 10/05/2020.   Compliance with diet, lifestyle and medications: Yes  He feels well now. History is that his daughter died and he decided to go out in [place] but he got into [place] became very diaphoretic exhausted and had typical anginal discomfort.  His EKG did not show ST elevation MI high-sensitivity troponin was elevated on the delta that was less than 100.  He had no recurrent atrial fibrillation and checked himself at home with the iPhone wireless adapter.  Otherwise no chest pain edema shortness of breath palpitation or syncope.  He tolerates his anticoagulant without interruption and is now on a statin.  Echocardiogram showed normal to hyperdynamic left ventricular function with mild LVH and moderate aortic regurgitation.  Left heart catheterization showed mild nonobstructive CAD in the proximal LAD and right coronary artery.  His high-sensitivity troponin was significantly elevated initial 20 repeat 80.  His EKG 10/05/2020 showed sinus rhythm first-degree AV block and he has restarted on flecainide having no significant obstructive CAD Past Medical History:  Diagnosis Date   Arthritis    Cancer ([place])    skin cancer, removed from bilateral arms   History of kidney stones    Hyperlipidemia    Hypertension    Palpitations    Sleep apnea    CPAP   Stroke ([place])    approx 2000, on plavix. When found on CT,  was told it was an "old stroke"    Past Surgical History:  Procedure Laterality Date   APPENDECTOMY     CARDIAC CATHETERIZATION     approx year 2000 @ [place] after stroke   CATARACT EXTRACTION, BILATERAL     EYE SURGERY     HERNIA REPAIR     LEFT HEART CATH AND CORONARY ANGIOGRAPHY N/A 10/04/2020   Procedure: LEFT HEART CATH AND CORONARY ANGIOGRAPHY;  Surgeon: Early Osmond, MD;   Location: [place] [facility];  Service: Cardiovascular;  Laterality: N/A;   LOOP RECORDER INSERTION     approx 2000    LOOP RECORDER REMOVAL     1 week after instertion, approx 2000   NASAL SINUS SURGERY     TRANSFORAMINAL LUMBAR INTERBODY FUSION W/ MIS 1 LEVEL N/A 01/01/2020   Procedure: TRANSFORAMINAL LUMBAR INTERBODY FUSION (TLIF) L4-L5;  Surgeon: Vallarie Mare, MD;  Location: [place];  Service: Neurosurgery;  Laterality: N/A;  TRANSFORAMINAL LUMBAR INTERBODY FUSION (TLIF) E3-M6    UMBILICAL HERNIA REPAIR      Current Medications: Current Meds  Medication Sig   amLODipine (NORVASC) 5 MG tablet Take 1 tablet (5 mg total) by mouth daily.   aspirin EC 81 MG tablet Take 1 tablet (81 mg total) by mouth daily. Swallow whole.   Choline Fenofibrate (FENOFIBRIC ACID) 135 MG CPDR Take 135 mg by mouth daily.   ELIQUIS 5 MG TABS tablet Take 1 tablet (5 mg total) by mouth 2 (two) times daily.   flecainide (TAMBOCOR) 50 MG tablet Take 1 tablet (50 mg total) by mouth 2 (two) times daily.   metoprolol succinate (TOPROL-XL) 50 MG 24 hr tablet Take 0.5 tablets (25 mg total) by mouth daily.   nitroGLYCERIN (NITROSTAT) 0.4 MG SL tablet Place 1 tablet (0.4 mg total) under the tongue every 5 (five) minutes x 3 doses as needed for chest pain.   simvastatin (ZOCOR) 20 MG tablet Take 20 mg by mouth daily.     Allergies:   Meperidine and Vicodin [hydrocodone-acetaminophen]   Social History   Socioeconomic History   Marital status: Married    Spouse name: Not on file   Number of children: Not on file   Years of education: Not on file   Highest education level: Not on file  Occupational History   Not on file  Tobacco Use   Smoking status: Former    Packs/day: 0.50    Years: 25.00    Pack years: 12.50    Types: Cigarettes    Quit date: 12/17/1998    Years since quitting: 21.8   Smokeless tobacco: Never   Tobacco comments:    quit in 2000  Vaping Use   Vaping Use: Never used  Substance and  Sexual Activity   Alcohol use: Not Currently    Comment: 1 beer occasionally   Drug use: Never   Sexual activity: Not on file  Other Topics Concern   Not on file  Social History Narrative   ** Merged History Encounter **       Social Determinants of Health   Financial Resource Strain: Not on file  Food Insecurity: Not on file  Transportation Needs: Not on file  Physical Activity: Not on file  Stress: Not on file  Social Connections: Not on file     Family History: The patient's family history includes Atrial fibrillation in his mother; Hypertension in his brother, brother, brother, brother, sister, sister, and sister; Liver cancer in his father; Lymphoma  in his mother. ROS:   Please see the history of present illness.    All other systems reviewed and are negative.  EKGs/Labs/Other Studies Reviewed:    The following studies were reviewed today:  EKG:  EKG ordered today and personally reviewed.  The ekg ordered today demonstrates sinus rhythm EKG normal  Recent Labs: 03/16/2020: NT-Pro BNP 127 10/03/2020: ALT 21 10/05/2020: BUN 13; Creatinine, Ser 0.95; Hemoglobin 12.4; Platelets 125; Potassium 3.7; Sodium 135  Recent Lipid Panel    Component Value Date/Time   CHOL 107 10/04/2020 0936   TRIG 89 10/04/2020 0936   HDL 32 (L) 10/04/2020 0936   CHOLHDL 3.3 10/04/2020 0936   VLDL 18 10/04/2020 0936   LDLCALC 57 10/04/2020 0936    Physical Exam:    VS:  BP 112/62 (BP Location: Right Arm, Patient Position: Sitting, Cuff Size: Normal)   Pulse 64   Ht 6' (1.829 m)   Wt 197 lb 6.4 oz (89.5 kg)   SpO2 97%   BMI 26.77 kg/m     Wt Readings from Last 3 Encounters:  10/19/20 197 lb 6.4 oz (89.5 kg)  10/04/20 190 lb (86.2 kg)  08/25/20 195 lb 9.6 oz (88.7 kg)     GEN:  Well nourished, well developed in no acute distress HEENT: Normal NECK: No JVD; No carotid bruits LYMPHATICS: No lymphadenopathy CARDIAC: RRR, no murmurs, rubs, gallops RESPIRATORY:  Clear to  auscultation without rales, wheezing or rhonchi  ABDOMEN: Soft, non-tender, non-distended MUSCULOSKELETAL:  No edema; No deformity  SKIN: Warm and dry NEUROLOGIC:  Alert and oriented x 3 PSYCHIATRIC:  Normal affect    Signed, Shirlee More, MD  10/19/2020 1:46 PM    [facility]

## 2020-10-19 NOTE — Patient Instructions (Signed)
Medication Instructions:  Your physician recommends that you continue on your current medications as directed. Please refer to the Current Medication list given to you today.  *If you need a refill on your cardiac medications before your next appointment, please call your pharmacy*   Lab Work: NONE If you have labs (blood work) drawn today and your tests are completely normal, you will receive your results only by: [place] (if you have MyChart) OR A paper copy in the mail If you have any lab test that is abnormal or we need to change your treatment, we will call you to review the results.   Testing/Procedures: EKG   Follow-Up: At [facility], you and your health needs are our priority.  As part of our continuing mission to provide you with exceptional heart care, we have created designated Provider Care Teams.  These Care Teams include your primary Cardiologist (physician) and Advanced Practice Providers (APPs -  Physician Assistants and Nurse Practitioners) who all work together to provide you with the care you need, when you need it.  We recommend signing up for the patient portal called "MyChart".  Sign up information is provided on this After Visit Summary.  MyChart is used to connect with patients for Virtual Visits (Telemedicine).  Patients are able to view lab/test results, encounter notes, upcoming appointments, etc.  Non-urgent messages can be sent to your provider as well.   To learn more about what you can do with MyChart, go to [email or web address].    Your next appointment:   6 month(s)  The format for your next appointment:   In Person  Provider:   Shirlee More, MD   Other Instructions Send report through MyChart if device shows Atrial Fibrillation

## 2020-11-10 DIAGNOSIS — C44519 Basal cell carcinoma of skin of other part of trunk: Secondary | ICD-10-CM | POA: Diagnosis not present

## 2020-11-18 ENCOUNTER — Telehealth: Payer: Self-pay | Admitting: Cardiology

## 2020-11-18 NOTE — Telephone Encounter (Signed)
New Message:      Patient says he has a cough. He wants to know what cough medicine can he take since he have high blood pressure.?

## 2020-11-18 NOTE — Telephone Encounter (Signed)
Spoke to patient just now and let him know that if he is looking for something prescription to take then he will need to contact his PCP. If he is wanting something over the counter then he should discuss this with his pharmacist. He states that he will do so and verbalizes understanding.    Encouraged patient to call back with any questions or concerns.

## 2020-11-26 DIAGNOSIS — J029 Acute pharyngitis, unspecified: Secondary | ICD-10-CM | POA: Diagnosis not present

## 2020-11-26 DIAGNOSIS — R051 Acute cough: Secondary | ICD-10-CM | POA: Diagnosis not present

## 2020-11-26 DIAGNOSIS — J069 Acute upper respiratory infection, unspecified: Secondary | ICD-10-CM | POA: Diagnosis not present

## 2020-11-26 DIAGNOSIS — J0100 Acute maxillary sinusitis, unspecified: Secondary | ICD-10-CM | POA: Diagnosis not present

## 2020-12-17 DIAGNOSIS — M545 Low back pain, unspecified: Secondary | ICD-10-CM | POA: Diagnosis not present

## 2020-12-17 DIAGNOSIS — Z6827 Body mass index (BMI) 27.0-27.9, adult: Secondary | ICD-10-CM | POA: Diagnosis not present

## 2020-12-24 DIAGNOSIS — Z6826 Body mass index (BMI) 26.0-26.9, adult: Secondary | ICD-10-CM | POA: Diagnosis not present

## 2020-12-24 DIAGNOSIS — M545 Low back pain, unspecified: Secondary | ICD-10-CM | POA: Diagnosis not present

## 2021-01-05 ENCOUNTER — Telehealth: Payer: Self-pay | Admitting: Cardiology

## 2021-01-05 NOTE — Telephone Encounter (Signed)
Patient calling the office for samples of medication:   1.  What medication and dosage are you requesting samples for? ELIQUIS 5 MG TABS tablet   2.  Are you currently out of this medication? Yes    

## 2021-01-05 NOTE — Telephone Encounter (Signed)
Spoke to the patient just now and let him know that these samples are ready for him to pick up. He verbalizes understanding.

## 2021-01-24 DIAGNOSIS — I10 Essential (primary) hypertension: Secondary | ICD-10-CM | POA: Diagnosis not present

## 2021-01-24 DIAGNOSIS — Z6827 Body mass index (BMI) 27.0-27.9, adult: Secondary | ICD-10-CM | POA: Diagnosis not present

## 2021-01-24 DIAGNOSIS — M4316 Spondylolisthesis, lumbar region: Secondary | ICD-10-CM | POA: Diagnosis not present

## 2021-01-31 IMAGING — RF DG C-ARM 1-60 MIN
1 series · 2 of 2 positions shown · non-contrast
Comparison: CT abdomen/pelvis 03/01/2009

CLINICAL DATA: Surgery, elective. Additional history provided:
L4-L5 TLIF. Provided fluoroscopy time 1 minutes, 13 seconds (51.44
mGy).

EXAM:
LUMBAR SPINE - 2-3 VIEW; DG C-ARM 1-60 MIN

[Series 1: unknown protocol · 0.14mm/px · 2 of 2 slices shown]
[im 1/2]
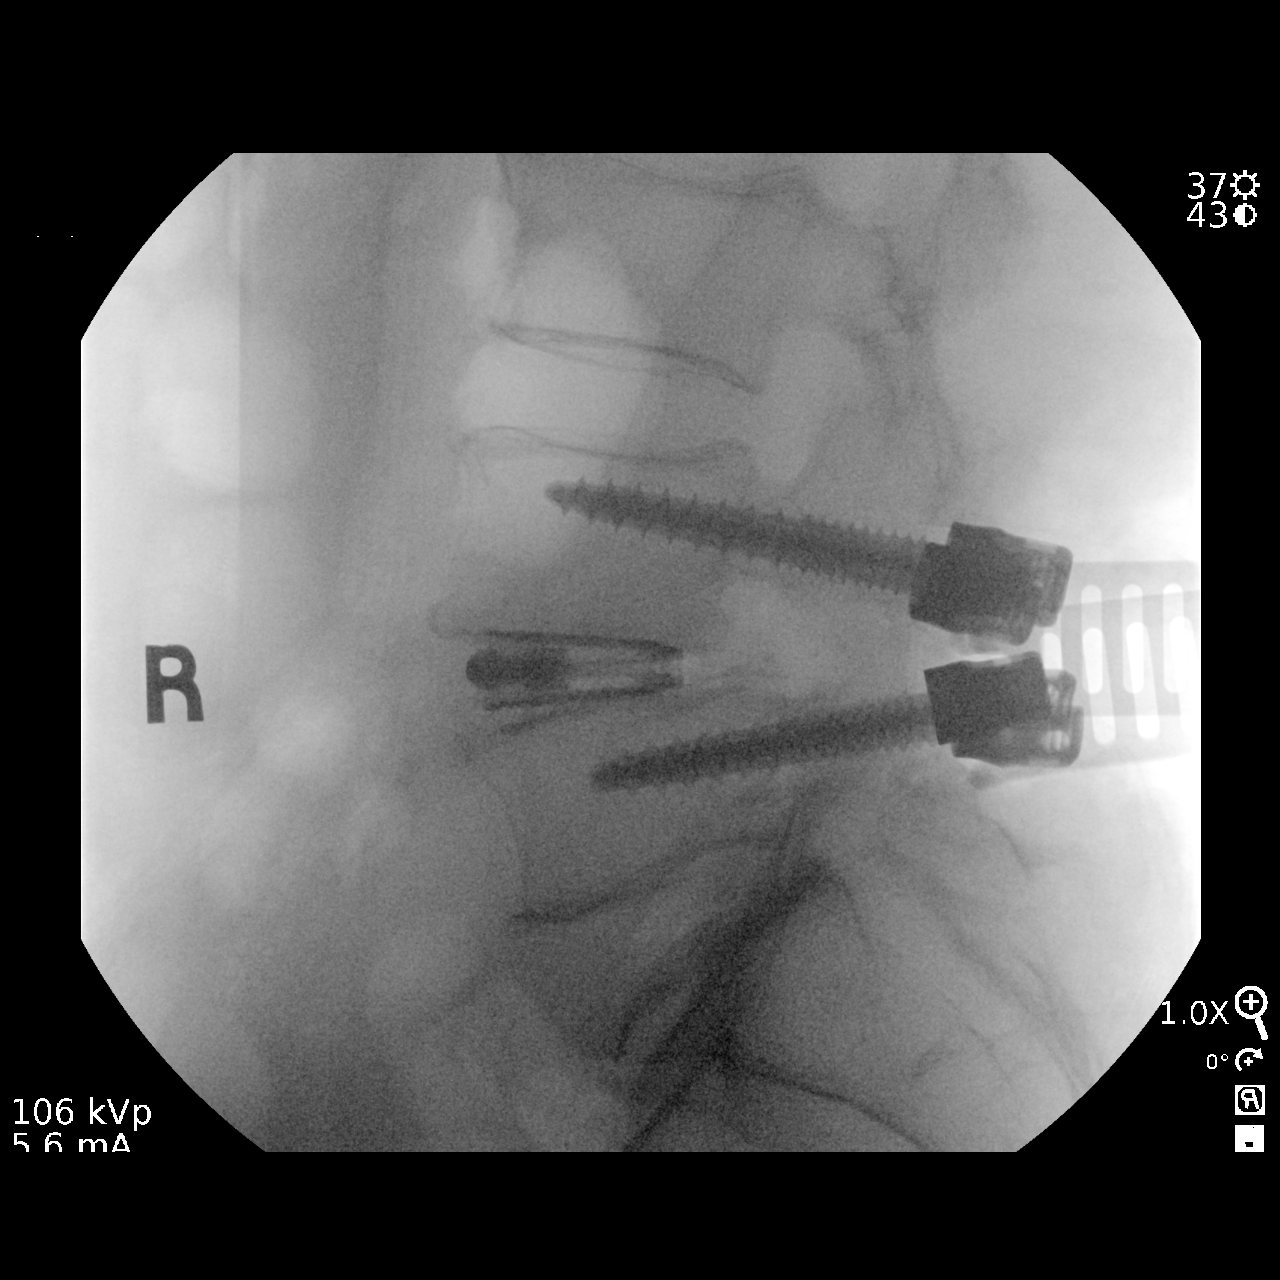
[im 2/2]
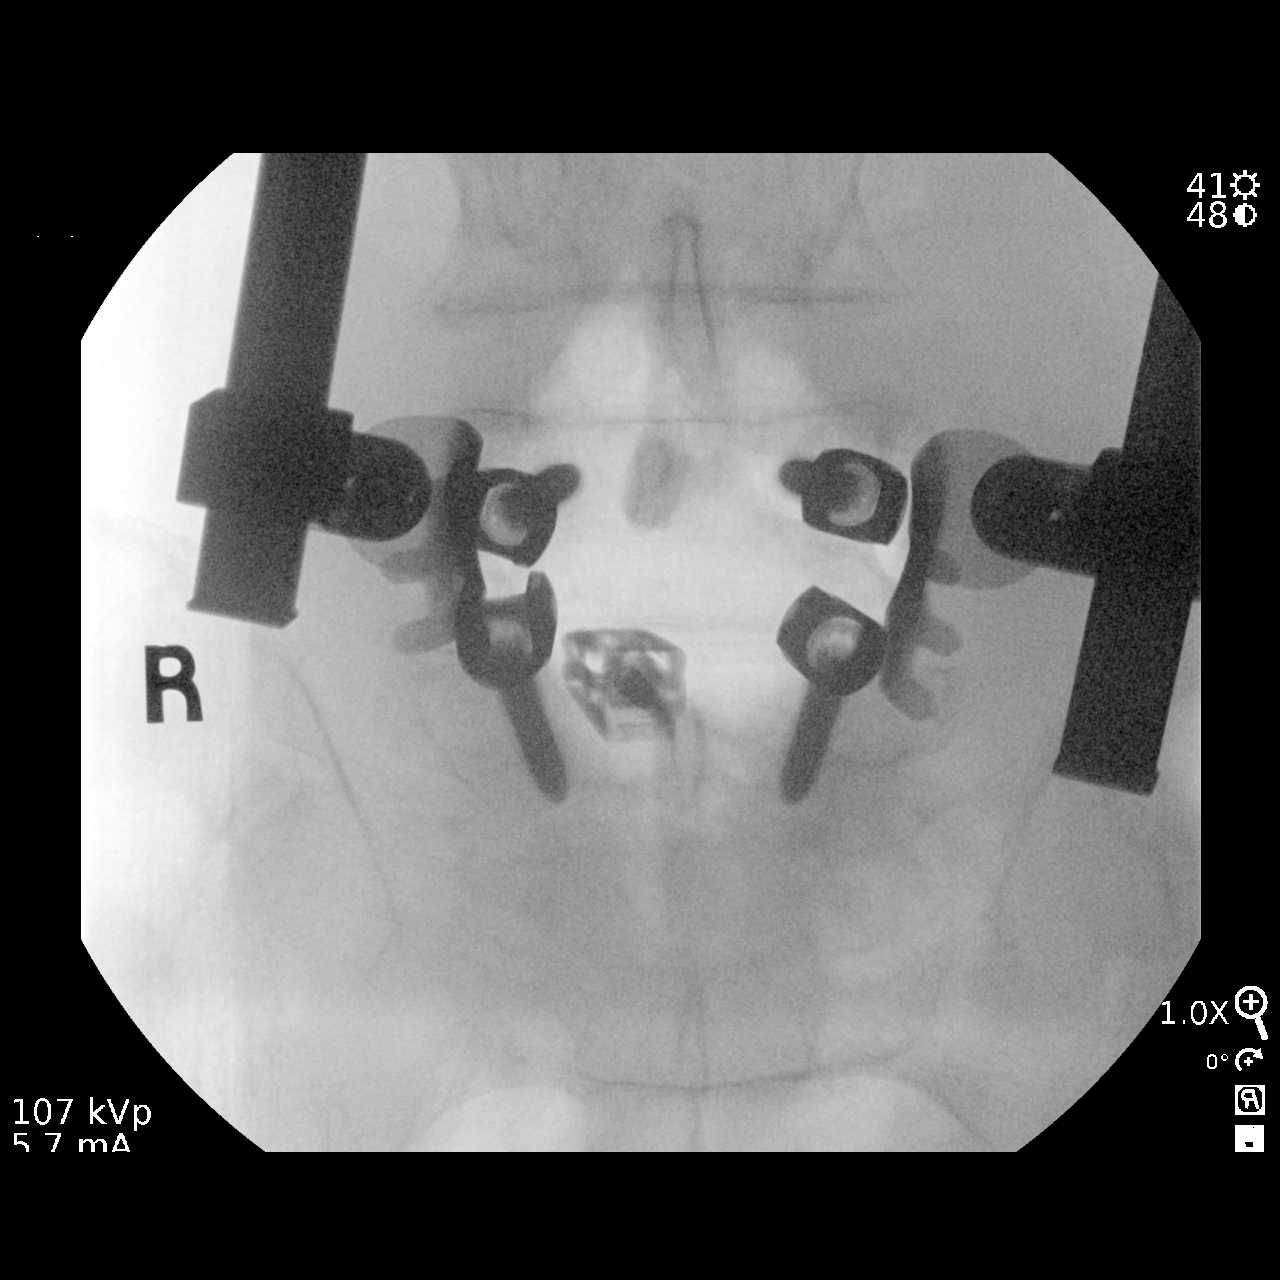

[2 of 2 positions shown; findings below may reference images not displayed]

FINDINGS: PA and lateral view intraoperative fluoroscopic images of the lumbar
spine are submitted, 2 images total. For the purposes of this
examination, the lowest well-formed intervertebral disc space is
designated L5-S1. The intraoperative fluoroscopic images demonstrate
bilateral pedicle screws at L4 and L5. Vertical interconnecting rods
were not present at the time the images were taken. L4-L5 interbody
device. Overlying retractors.
IMPRESSION: Two intraoperative fluoroscopic images from L4-L5 posterior spinal
fusion, as described.

## 2021-01-31 IMAGING — RF DG LUMBAR SPINE 2-3V
1 series · 2 of 2 positions shown · non-contrast
Comparison: CT abdomen/pelvis 03/01/2009

CLINICAL DATA: Surgery, elective. Additional history provided:
L4-L5 TLIF. Provided fluoroscopy time 1 minutes, 13 seconds (51.44
mGy).

EXAM:
LUMBAR SPINE - 2-3 VIEW; DG C-ARM 1-60 MIN

[Series 1: unknown protocol · 0.14mm/px · 2 of 2 slices shown]
[im 1/2]
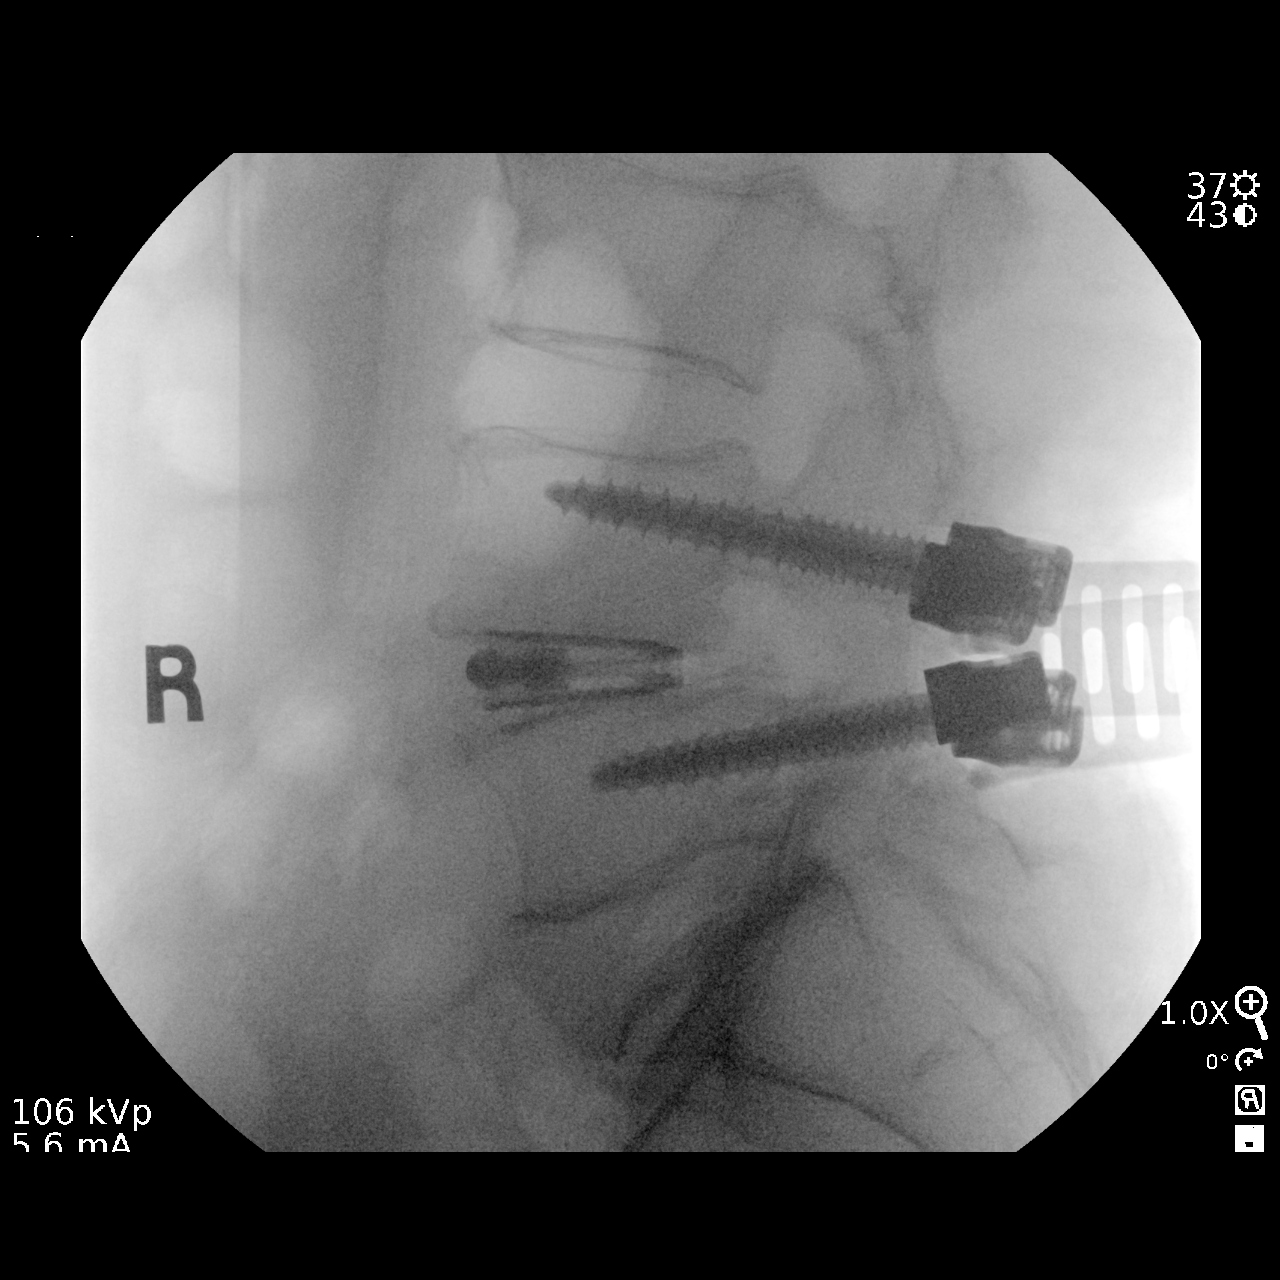
[im 2/2]
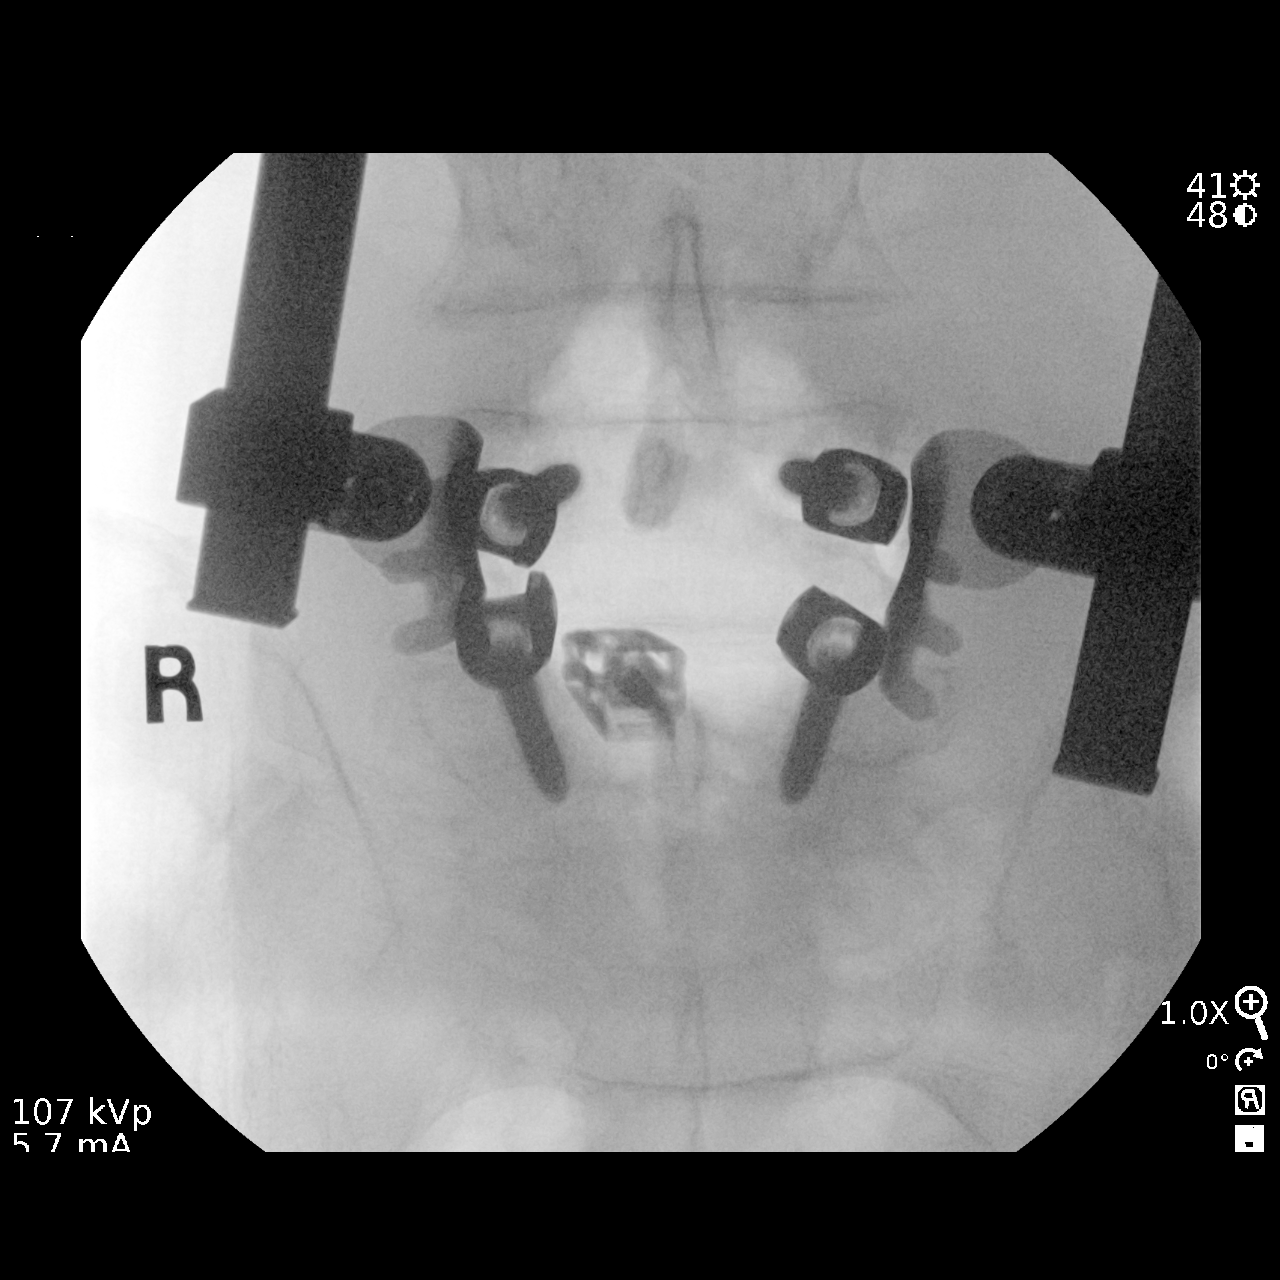

[2 of 2 positions shown; findings below may reference images not displayed]

FINDINGS: PA and lateral view intraoperative fluoroscopic images of the lumbar
spine are submitted, 2 images total. For the purposes of this
examination, the lowest well-formed intervertebral disc space is
designated L5-S1. The intraoperative fluoroscopic images demonstrate
bilateral pedicle screws at L4 and L5. Vertical interconnecting rods
were not present at the time the images were taken. L4-L5 interbody
device. Overlying retractors.
IMPRESSION: Two intraoperative fluoroscopic images from L4-L5 posterior spinal
fusion, as described.

## 2021-02-20 ENCOUNTER — Other Ambulatory Visit: Payer: Self-pay | Admitting: Cardiology

## 2021-03-16 DIAGNOSIS — R310 Gross hematuria: Secondary | ICD-10-CM | POA: Diagnosis not present

## 2021-03-16 DIAGNOSIS — Z6827 Body mass index (BMI) 27.0-27.9, adult: Secondary | ICD-10-CM | POA: Diagnosis not present

## 2021-03-17 DIAGNOSIS — N202 Calculus of kidney with calculus of ureter: Secondary | ICD-10-CM | POA: Diagnosis not present

## 2021-03-17 DIAGNOSIS — N201 Calculus of ureter: Secondary | ICD-10-CM | POA: Diagnosis not present

## 2021-03-17 DIAGNOSIS — N134 Hydroureter: Secondary | ICD-10-CM | POA: Diagnosis not present

## 2021-03-17 DIAGNOSIS — N200 Calculus of kidney: Secondary | ICD-10-CM | POA: Diagnosis not present

## 2021-03-17 DIAGNOSIS — N50811 Right testicular pain: Secondary | ICD-10-CM | POA: Diagnosis not present

## 2021-04-19 DIAGNOSIS — S92354A Nondisplaced fracture of fifth metatarsal bone, right foot, initial encounter for closed fracture: Secondary | ICD-10-CM | POA: Diagnosis not present

## 2021-04-26 ENCOUNTER — Ambulatory Visit: Payer: PPO | Admitting: Cardiology

## 2021-04-26 ENCOUNTER — Encounter: Payer: Self-pay | Admitting: Cardiology

## 2021-04-26 VITALS — BP 132/68 | HR 62 | Ht 72.0 in | Wt 195.6 lb

## 2021-04-26 DIAGNOSIS — I480 Paroxysmal atrial fibrillation: Secondary | ICD-10-CM | POA: Diagnosis not present

## 2021-04-26 DIAGNOSIS — I351 Nonrheumatic aortic (valve) insufficiency: Secondary | ICD-10-CM | POA: Diagnosis not present

## 2021-04-26 DIAGNOSIS — Z7901 Long term (current) use of anticoagulants: Secondary | ICD-10-CM

## 2021-04-26 DIAGNOSIS — I25110 Atherosclerotic heart disease of native coronary artery with unstable angina pectoris: Secondary | ICD-10-CM

## 2021-04-26 DIAGNOSIS — E782 Mixed hyperlipidemia: Secondary | ICD-10-CM

## 2021-04-26 DIAGNOSIS — I110 Hypertensive heart disease with heart failure: Secondary | ICD-10-CM

## 2021-04-26 NOTE — Progress Notes (Signed)
?Cardiology Office Note:   ? ?Date:  04/26/2021  ? ?ID:  Benjamin Griffin, DOB [birth year], MRN [id number] ? ?PCP:  Ronita Hipps, MD  ?Cardiologist:  Shirlee More, MD   ? ?Referring MD: Ronita Hipps, MD  ? ? ?ASSESSMENT:   ? ?1. Paroxysmal atrial fibrillation (HCC)   ?2. Chronic anticoagulation   ?3. Coronary artery disease involving native coronary artery of native heart with unstable angina pectoris ([place])   ?4. Hypertensive heart disease with heart failure ([place])   ?5. Mixed hyperlipidemia   ?6. Nonrheumatic aortic valve insufficiency   ? ?PLAN:   ? ?In order of problems listed above: ? ?He is doing well on low-dose flecainide EKG shows no finding of toxicity continue the same along with his anticoagulant beta-blocker. 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  He has minimal coronary atherosclerosis can continue his antiarrhythmic drug ?Stable blood pressure at target continue beta-blocker ?Continue statin ?I will ask him to have an echocardiogram done in September follow-up in 9 months ? ? ?Next appointment: 9 mos ? ? ?Medication Adjustments/Labs and Tests Ordered: ?Current medicines are reviewed at length with the patient today.  Concerns regarding medicines are outlined above.  ?Orders Placed This Encounter  ?Procedures  ? EKG 12-Lead  ? ?No orders of the defined types were placed in this encounter. ? ? ?No chief complaint on file. ? ? ?History of Present Illness:   ? ?Benjamin Griffin is a 71 y.o. male with a hx of paroxysmal atrial fibrillation anticoagulated hypertensive heart disease dyslipidemia most recently non-ST elevation MI in 10/03/2020  last seen 10/19/2020.Echocardiogram showed normal to hyperdynamic left ventricular function with mild LVH and moderate aortic regurgitation pressure half-time indicated mild regurgitation of 526 ms.  Left heart catheterization showed  mild nonobstructive CAD in the proximal LAD and right coronary artery.  His  ?high-sensitivity troponin was significantly elevated. ? ?Compliance with diet, lifestyle and medications: Yes ? ?He remains very active he feels well.  He never started taking aspirin he takes his anticoagulant has had no bleeding. ?No episodes of atrial fibrillation chest pain edema shortness of breath ?He tolerates his statin without muscle pain or weakness ?He will have a lipid profile this week follow-up with his primary care physician ?Lipid profile 10/04/2020 cholesterol 107 LDL 57 ?Past Medical History:  ?Diagnosis Date  ? Arthritis   ? Cancer [facility])   ? skin cancer, removed from bilateral arms  ? History of kidney stones   ? Hyperlipidemia   ? Hypertension   ? Palpitations   ? Sleep apnea   ? CPAP  ? Stroke [facility])   ? approx 2000, on plavix. When found on CT, was told it was an "old stroke"  ? ? ?Past Surgical History:  ?Procedure Laterality Date  ? APPENDECTOMY    ? CARDIAC CATHETERIZATION    ? approx year 2000 @ [facility] after stroke  ? CATARACT EXTRACTION, BILATERAL    ? EYE SURGERY    ? HERNIA REPAIR    ? LEFT HEART CATH AND CORONARY ANGIOGRAPHY N/A 10/04/2020  ? Procedure: LEFT HEART CATH AND CORONARY ANGIOGRAPHY;  Surgeon: Early Osmond, MD;  Location: [place] [facility];  Service: Cardiovascular;  Laterality: N/A;  ? LOOP RECORDER INSERTION    ? approx 2000   ? LOOP RECORDER REMOVAL    ? 1 week after instertion, approx  2000  ? NASAL SINUS SURGERY    ? TRANSFORAMINAL LUMBAR INTERBODY FUSION W/ MIS 1 LEVEL N/A 01/01/2020  ? Procedure: TRANSFORAMINAL LUMBAR INTERBODY FUSION (TLIF) L4-L5;  Surgeon: Vallarie Mare, MD;  Location: [place];  Service: Neurosurgery;  Laterality: N/A;  TRANSFORAMINAL LUMBAR INTERBODY FUSION (TLIF) L4-L5   ? UMBILICAL HERNIA REPAIR    ? ? ?Current Medications: ?Current Meds  ?Medication Sig  ? amLODipine (NORVASC) 5 MG tablet Take 1 tablet by mouth once daily  ? Choline Fenofibrate (FENOFIBRIC  ACID) 135 MG CPDR Take 135 mg by mouth daily.  ? ELIQUIS 5 MG TABS tablet Take 1 tablet (5 mg total) by mouth 2 (two) times daily.  ? flecainide (TAMBOCOR) 50 MG tablet Take 1 tablet by mouth twice daily  ? metoprolol succinate (TOPROL-XL) 50 MG 24 hr tablet Take 0.5 tablets (25 mg total) by mouth daily.  ? nitroGLYCERIN (NITROSTAT) 0.4 MG SL tablet Place 1 tablet (0.4 mg total) under the tongue every 5 (five) minutes x 3 doses as needed for chest pain.  ? simvastatin (ZOCOR) 20 MG tablet Take 20 mg by mouth daily.  ?  ? ?Allergies:   Meperidine and Vicodin [hydrocodone-acetaminophen]  ? ?Social History  ? ?Socioeconomic History  ? Marital status: Married  ?  Spouse name: Not on file  ? Number of children: Not on file  ? Years of education: Not on file  ? Highest education level: Not on file  ?Occupational History  ? Not on file  ?Tobacco Use  ? Smoking status: Former  ?  Packs/day: 0.50  ?  Years: 25.00  ?  Pack years: 12.50  ?  Types: Cigarettes  ?  Quit date: 12/17/1998  ?  Years since quitting: 22.3  ? Smokeless tobacco: Never  ? Tobacco comments:  ?  quit in 2000  ?Vaping Use  ? Vaping Use: Never used  ?Substance and Sexual Activity  ? Alcohol use: Not Currently  ?  Comment: 1 beer occasionally  ? Drug use: Never  ? Sexual activity: Not on file  ?Other Topics Concern  ? Not on file  ?Social History Narrative  ? ** Merged History Encounter **  ?    ? ?Social Determinants of Health  ? ?Financial Resource Strain: Not on file  ?Food Insecurity: Not on file  ?Transportation Needs: Not on file  ?Physical Activity: Not on file  ?Stress: Not on file  ?Social Connections: Not on file  ?  ? ?Family History: ?The patient's family history includes Atrial fibrillation in his mother; Hypertension in his brother, brother, brother, brother, sister, sister, and sister; Liver cancer in his father; Lymphoma in his mother. ?ROS:   ?Please see the history of present illness.    ?All other systems reviewed and are  negative. ? ?EKGs/Labs/Other Studies Reviewed:   ? ?The following studies were reviewed today: ? ?EKG:  EKG ordered today and personally reviewed.  The ekg ordered today demonstrates sinus rhythm normal EKG ? ?Recent Labs: ?10/03/2020: ALT 21 ?10/05/2020: BUN 13; Creatinine, Ser 0.95; Hemoglobin 12.4; Platelets 125; Potassium 3.7; Sodium 135  ?Recent Lipid Panel ?   ?Component Value Date/Time  ? CHOL 107 10/04/2020 0936  ? TRIG 89 10/04/2020 0936  ? HDL 32 (L) 10/04/2020 0936  ? CHOLHDL 3.3 10/04/2020 0936  ? VLDL 18 10/04/2020 0936  ? [place] 57 10/04/2020 0936  ? ? ?Physical Exam:   ? ?VS:  BP 132/68 (BP Location: Right Arm)   Pulse 62   Ht  6' (1.829 m)   Wt 195 lb 9 oz (88.7 kg)   SpO2 97%   BMI 26.52 kg/m?    ? ?Wt Readings from Last 3 Encounters:  ?04/26/21 195 lb 9 oz (88.7 kg)  ?10/19/20 197 lb 6.4 oz (89.5 kg)  ?10/04/20 190 lb (86.2 kg)  ?  ? ?GEN: Looks younger than his age well nourished, well developed in no acute distress ?HEENT: Normal ?NECK: No JVD; No carotid bruits ?LYMPHATICS: No lymphadenopathy ?CARDIAC: RRR, no murmurs, rubs, gallops ?RESPIRATORY:  Clear to auscultation without rales, wheezing or rhonchi  ?ABDOMEN: Soft, non-tender, non-distended ?MUSCULOSKELETAL:  No edema; No deformity  ?SKIN: Warm and dry ?NEUROLOGIC:  Alert and oriented x 3 ?PSYCHIATRIC:  Normal affect  ? ? ?Signed, ?Shirlee More, MD  ?04/26/2021 4:11 PM    ?[place]  ?

## 2021-04-26 NOTE — Patient Instructions (Addendum)
Medication Instructions:  Your physician recommends that you continue on your current medications as directed. Please refer to the Current Medication list given to you today.  *If you need a refill on your cardiac medications before your next appointment, please call your pharmacy*   Lab Work: None If you have labs (blood work) drawn today and your tests are completely normal, you will receive your results only by: MyChart Message (if you have MyChart) OR A paper copy in the mail If you have any lab test that is abnormal or we need to change your treatment, we will call you to review the results.   Testing/Procedures: Your physician has requested that you have an echocardiogram. Echocardiography is a painless test that uses sound waves to create images of your heart. It provides your doctor with information about the size and shape of your heart and how well your heart's chambers and valves are working. This procedure takes approximately one hour. There are no restrictions for this procedure.    Follow-Up: At [facility], you and your health needs are our priority.  As part of our continuing mission to provide you with exceptional heart care, we have created designated Provider Care Teams.  These Care Teams include your primary Cardiologist (physician) and Advanced Practice Providers (APPs -  Physician Assistants and Nurse Practitioners) who all work together to provide you with the care you need, when you need it.  We recommend signing up for the patient portal called "MyChart".  Sign up information is provided on this After Visit Summary.  MyChart is used to connect with patients for Virtual Visits (Telemedicine).  Patients are able to view lab/test results, encounter notes, upcoming appointments, etc.  Non-urgent messages can be sent to your provider as well.   To learn more about what you can do with MyChart, go to [email or web address].    Your next appointment:   9  month(s)  The format for your next appointment:   In Person  Provider:   Brian Munley, MD    Other Instructions None  Important Information About Sugar       

## 2021-04-28 DIAGNOSIS — S40811A Abrasion of right upper arm, initial encounter: Secondary | ICD-10-CM | POA: Diagnosis not present

## 2021-04-28 DIAGNOSIS — Z1331 Encounter for screening for depression: Secondary | ICD-10-CM | POA: Diagnosis not present

## 2021-04-28 DIAGNOSIS — Z0000 Encounter for general adult medical examination without abnormal findings: Secondary | ICD-10-CM | POA: Diagnosis not present

## 2021-04-28 DIAGNOSIS — I10 Essential (primary) hypertension: Secondary | ICD-10-CM | POA: Diagnosis not present

## 2021-04-28 DIAGNOSIS — E7800 Pure hypercholesterolemia, unspecified: Secondary | ICD-10-CM | POA: Diagnosis not present

## 2021-04-28 DIAGNOSIS — Z6827 Body mass index (BMI) 27.0-27.9, adult: Secondary | ICD-10-CM | POA: Diagnosis not present

## 2021-04-28 DIAGNOSIS — Z79899 Other long term (current) drug therapy: Secondary | ICD-10-CM | POA: Diagnosis not present

## 2021-04-28 DIAGNOSIS — Z125 Encounter for screening for malignant neoplasm of prostate: Secondary | ICD-10-CM | POA: Diagnosis not present

## 2021-05-02 ENCOUNTER — Other Ambulatory Visit: Payer: Self-pay | Admitting: Cardiology

## 2021-05-02 NOTE — Telephone Encounter (Signed)
Prescription refill request for Eliquis received. ?Indication:Afib ?Last office visit:4/23 ?Scr:0.9 ?Age: 72 ?Weight:88.7 kg ? ?Prescription refilled ? ?

## 2021-05-04 ENCOUNTER — Ambulatory Visit (INDEPENDENT_AMBULATORY_CARE_PROVIDER_SITE_OTHER): Payer: PPO

## 2021-05-04 DIAGNOSIS — E782 Mixed hyperlipidemia: Secondary | ICD-10-CM

## 2021-05-04 DIAGNOSIS — Z7901 Long term (current) use of anticoagulants: Secondary | ICD-10-CM

## 2021-05-04 DIAGNOSIS — I480 Paroxysmal atrial fibrillation: Secondary | ICD-10-CM

## 2021-05-04 DIAGNOSIS — I25110 Atherosclerotic heart disease of native coronary artery with unstable angina pectoris: Secondary | ICD-10-CM

## 2021-05-04 DIAGNOSIS — I351 Nonrheumatic aortic (valve) insufficiency: Secondary | ICD-10-CM | POA: Diagnosis not present

## 2021-05-04 DIAGNOSIS — I110 Hypertensive heart disease with heart failure: Secondary | ICD-10-CM

## 2021-05-04 LAB — ECHOCARDIOGRAM COMPLETE
Area-P 1/2: 4.21 cm2
MV M vel: 4.18 m/s
MV Peak grad: 69.9 mmHg
P 1/2 time: 546 msec
S' Lateral: 3.2 cm

## 2021-05-06 DIAGNOSIS — G4733 Obstructive sleep apnea (adult) (pediatric): Secondary | ICD-10-CM | POA: Diagnosis not present

## 2021-05-21 DIAGNOSIS — M25571 Pain in right ankle and joints of right foot: Secondary | ICD-10-CM | POA: Diagnosis not present

## 2021-05-21 DIAGNOSIS — M84374A Stress fracture, right foot, initial encounter for fracture: Secondary | ICD-10-CM | POA: Diagnosis not present

## 2021-05-31 DIAGNOSIS — L820 Inflamed seborrheic keratosis: Secondary | ICD-10-CM | POA: Diagnosis not present

## 2021-05-31 DIAGNOSIS — D485 Neoplasm of uncertain behavior of skin: Secondary | ICD-10-CM | POA: Diagnosis not present

## 2021-05-31 DIAGNOSIS — D225 Melanocytic nevi of trunk: Secondary | ICD-10-CM | POA: Diagnosis not present

## 2021-06-05 DIAGNOSIS — G4733 Obstructive sleep apnea (adult) (pediatric): Secondary | ICD-10-CM | POA: Diagnosis not present

## 2021-06-10 ENCOUNTER — Encounter: Payer: Self-pay | Admitting: Pulmonary Disease

## 2021-06-10 ENCOUNTER — Ambulatory Visit: Payer: PPO | Admitting: Pulmonary Disease

## 2021-06-10 VITALS — BP 110/70 | HR 60 | Temp 97.9°F | Ht 72.0 in | Wt 195.0 lb

## 2021-06-10 DIAGNOSIS — G4733 Obstructive sleep apnea (adult) (pediatric): Secondary | ICD-10-CM

## 2021-06-10 DIAGNOSIS — Z9989 Dependence on other enabling machines and devices: Secondary | ICD-10-CM | POA: Diagnosis not present

## 2021-06-10 NOTE — Progress Notes (Signed)
Benjamin Griffin    [id number]    [birth year]  Primary Care Physician:Holt, Gara Kroner, MD  Referring Physician: Ronita Hipps, MD [place] [id number],   Chief complaint:   Patient with mild obstructive sleep apnea CPAP is working well  HPI:  Had biopsy of left facial swelling Noncancerous lesion Mask changed to a nasal pillow has helped  Sleeping better  Mask changes being very helpful  He had back surgery about December and did have recurrence of his facial swelling, he was on antibiotics at that time, but that course of antibiotics did not help his swelling.  Facial swelling is better   Has a history of coronary artery disease, history of stroke, atrial fibrillation  Feels well rested following CPAP use  Reformed smoker No pertinent occupational history   Outpatient Encounter Medications as of 06/10/2021  Medication Sig   amLODipine (NORVASC) 5 MG tablet Take 1 tablet by mouth once daily   Choline Fenofibrate (FENOFIBRIC ACID) 135 MG CPDR Take 135 mg by mouth daily.   ELIQUIS 5 MG TABS tablet Take 1 tablet by mouth twice daily   flecainide (TAMBOCOR) 50 MG tablet Take 1 tablet by mouth twice daily   metoprolol succinate (TOPROL-XL) 50 MG 24 hr tablet Take 0.5 tablets (25 mg total) by mouth daily.   nitroGLYCERIN (NITROSTAT) 0.4 MG SL tablet Place 1 tablet (0.4 mg total) under the tongue every 5 (five) minutes x 3 doses as needed for chest pain.   simvastatin (ZOCOR) 20 MG tablet Take 20 mg by mouth daily.   No facility-administered encounter medications on file as of 06/10/2021.    Allergies as of 06/10/2021 - Review Complete 06/10/2021  Allergen Reaction Noted   Meperidine Other (See Comments) 06/03/2019   Vicodin [hydrocodone-acetaminophen] Nausea And Vomiting 12/24/2019    Past Medical History:  Diagnosis Date   Arthritis    Cancer ([place])    skin cancer, removed from bilateral arms   History of kidney stones    Hyperlipidemia     Hypertension    Palpitations    Sleep apnea    CPAP   Stroke ([place])    approx 2000, on plavix. When found on CT, was told it was an "old stroke"    Past Surgical History:  Procedure Laterality Date   APPENDECTOMY     CARDIAC CATHETERIZATION     approx year 2000 @ [place] after stroke   CATARACT EXTRACTION, BILATERAL     EYE SURGERY     HERNIA REPAIR     LEFT HEART CATH AND CORONARY ANGIOGRAPHY N/A 10/04/2020   Procedure: LEFT HEART CATH AND CORONARY ANGIOGRAPHY;  Surgeon: Early Osmond, MD;  Location: [facility];  Service: Cardiovascular;  Laterality: N/A;   LOOP RECORDER INSERTION     approx 2000    LOOP RECORDER REMOVAL     1 week after instertion, approx 2000   NASAL SINUS SURGERY     TRANSFORAMINAL LUMBAR INTERBODY FUSION W/ MIS 1 LEVEL N/A 01/01/2020   Procedure: TRANSFORAMINAL LUMBAR INTERBODY FUSION (TLIF) L4-L5;  Surgeon: Vallarie Mare, MD;  Location: [facility];  Service: Neurosurgery;  Laterality: N/A;  TRANSFORAMINAL LUMBAR INTERBODY FUSION (TLIF) M3-N3    UMBILICAL HERNIA REPAIR      Family History  Problem Relation Age of Onset   Lymphoma Mother    Atrial fibrillation Mother    Liver cancer Father    Hypertension Sister  Hypertension Brother    Hypertension Brother    Hypertension Brother    Hypertension Brother    Hypertension Sister    Hypertension Sister     Social History   Socioeconomic History   Marital status: Married    Spouse name: Not on file   Number of children: Not on file   Years of education: Not on file   Highest education level: Not on file  Occupational History   Not on file  Tobacco Use   Smoking status: Former    Packs/day: 0.50    Years: 25.00    Pack years: 12.50    Types: Cigarettes    Quit date: 12/17/1998    Years since quitting: 22.4   Smokeless tobacco: Never   Tobacco comments:    quit in 2000  Vaping Use   Vaping Use: Never used  Substance and Sexual Activity   Alcohol use: Not Currently     Comment: 1 beer occasionally   Drug use: Never   Sexual activity: Not on file  Other Topics Concern   Not on file  Social History Narrative   ** Merged History Encounter **       Social Determinants of Health   Financial Resource Strain: Not on file  Food Insecurity: Not on file  Transportation Needs: Not on file  Physical Activity: Not on file  Stress: Not on file  Social Connections: Not on file  Intimate Partner Violence: Not on file    Review of Systems  Constitutional:  Negative for fatigue.  Respiratory:  Positive for apnea. Negative for shortness of breath.   Psychiatric/Behavioral:  Positive for sleep disturbance.    Vitals:   06/10/21 1109  BP: 110/70  Pulse: 60  Temp: 97.9 F (36.6 C)  SpO2: 98%     Physical Exam Constitutional:      Appearance: He is obese.  HENT:     Head: Normocephalic and atraumatic.     Nose: Nose normal.     Mouth/Throat:     Mouth: Mucous membranes are moist.     Comments: Mallampati 3, crowded oropharynx Eyes:     General:        Right eye: No discharge.        Left eye: No discharge.     Pupils: Pupils are equal, round, and reactive to light.  Cardiovascular:     Rate and Rhythm: Normal rate and regular rhythm.     Heart sounds: No murmur heard.   No friction rub.  Pulmonary:     Effort: No respiratory distress.     Breath sounds: No stridor. No wheezing or rhonchi.  Musculoskeletal:     Cervical back: No tenderness.  Neurological:     Mental Status: He is alert.  Psychiatric:        Mood and Affect: Mood normal.   Data Reviewed: Sleep study results showing mild obstructive sleep apnea  Download from the machine showing 100% compliance with CPAP Average use of 8 hours 51 minutes Machine set between 8 and 15 Residual AHI 1.6  Assessment:  History of mild obstructive sleep apnea -Compliant with CPAP use -Benefiting from treatment with CPAP  Facial swelling - Did have recent biopsy of lesion around left  nasal bridge- -Noted to be a benign lesion  History of hypertension Atrial fibrillation History of CVA  Plan/Recommendations: Continue CPAP nightly  Follow-up in 6 months  Call with significant concerns   Sherrilyn Rist MD [facility]  06/10/2021, 11:15 AM  CC: Ronita Hipps, MD

## 2021-06-10 NOTE — Patient Instructions (Signed)
Continue with CPAP nightly  Call with significant concerns  We will see you back in 6 months

## 2021-06-15 DIAGNOSIS — E7800 Pure hypercholesterolemia, unspecified: Secondary | ICD-10-CM | POA: Diagnosis not present

## 2021-06-15 DIAGNOSIS — I10 Essential (primary) hypertension: Secondary | ICD-10-CM | POA: Diagnosis not present

## 2021-06-15 DIAGNOSIS — G4730 Sleep apnea, unspecified: Secondary | ICD-10-CM | POA: Diagnosis not present

## 2021-06-22 DIAGNOSIS — C44612 Basal cell carcinoma of skin of right upper limb, including shoulder: Secondary | ICD-10-CM | POA: Diagnosis not present

## 2021-07-06 DIAGNOSIS — G4733 Obstructive sleep apnea (adult) (pediatric): Secondary | ICD-10-CM | POA: Diagnosis not present

## 2021-08-05 DIAGNOSIS — G4733 Obstructive sleep apnea (adult) (pediatric): Secondary | ICD-10-CM | POA: Diagnosis not present

## 2021-08-05 DIAGNOSIS — I10 Essential (primary) hypertension: Secondary | ICD-10-CM | POA: Diagnosis not present

## 2021-08-05 DIAGNOSIS — Z8673 Personal history of transient ischemic attack (TIA), and cerebral infarction without residual deficits: Secondary | ICD-10-CM | POA: Diagnosis not present

## 2021-08-09 ENCOUNTER — Other Ambulatory Visit: Payer: Self-pay | Admitting: Cardiology

## 2021-08-23 DIAGNOSIS — C44612 Basal cell carcinoma of skin of right upper limb, including shoulder: Secondary | ICD-10-CM | POA: Diagnosis not present

## 2021-09-05 DIAGNOSIS — G4733 Obstructive sleep apnea (adult) (pediatric): Secondary | ICD-10-CM | POA: Diagnosis not present

## 2021-09-15 DIAGNOSIS — E7800 Pure hypercholesterolemia, unspecified: Secondary | ICD-10-CM | POA: Diagnosis not present

## 2021-09-15 DIAGNOSIS — I10 Essential (primary) hypertension: Secondary | ICD-10-CM | POA: Diagnosis not present

## 2021-09-15 DIAGNOSIS — G4730 Sleep apnea, unspecified: Secondary | ICD-10-CM | POA: Diagnosis not present

## 2021-09-23 DIAGNOSIS — L03031 Cellulitis of right toe: Secondary | ICD-10-CM | POA: Diagnosis not present

## 2021-10-06 DIAGNOSIS — G4733 Obstructive sleep apnea (adult) (pediatric): Secondary | ICD-10-CM | POA: Diagnosis not present

## 2021-10-11 ENCOUNTER — Ambulatory Visit: Payer: PPO | Admitting: Podiatry

## 2021-10-11 ENCOUNTER — Ambulatory Visit: Payer: PPO

## 2021-10-11 DIAGNOSIS — M21619 Bunion of unspecified foot: Secondary | ICD-10-CM

## 2021-10-11 DIAGNOSIS — L84 Corns and callosities: Secondary | ICD-10-CM

## 2021-10-11 DIAGNOSIS — M7751 Other enthesopathy of right foot: Secondary | ICD-10-CM | POA: Diagnosis not present

## 2021-10-11 DIAGNOSIS — L03031 Cellulitis of right toe: Secondary | ICD-10-CM

## 2021-10-11 DIAGNOSIS — L600 Ingrowing nail: Secondary | ICD-10-CM | POA: Diagnosis not present

## 2021-10-11 MED ORDER — CEPHALEXIN 500 MG PO CAPS: 500.0000 mg | ORAL_CAPSULE | Freq: Three times a day (TID) | ORAL | 0 refills | Status: AC

## 2021-10-11 NOTE — Patient Instructions (Signed)

## 2021-10-11 NOTE — Progress Notes (Unsigned)
Subjective:  Patient ID: Benjamin Griffin, male    DOB: 1950/11/14,  MRN: [id number]  Chief Complaint  Patient presents with   Bunions    right foot bunion   Toe Pain    Patient had a pedicure about 2 months ago. Patient had an infection and was prescribed Augmentin 834m. Medication helped but toe is red and painful.     71y.o. male presents with pain at 2 areas of the right foot.  His primary complaint is related to a painful nail border on the right great toe.  This has been present for about 1 to 2 months.  He has had prior pedicure and afterwards noticed some redness swelling and pain with pressure on the outside border of the right great toenail.  Area has been draining a little bit of blood.  He also relates concern with a "bunion" on the outside of his right midfoot.  He has pain with pressure underneath a prominent bone present at the area.  Has not tried anything for it yet.  Past Medical History:  Diagnosis Date   Arthritis    [place]    skin cancer, removed from bilateral arms   History of kidney stones    Hyperlipidemia    Hypertension    Palpitations    Sleep apnea    CPAP   [place]    approx 2000, on plavix. When found on CT, was told it was an "old stroke"    Allergies  Allergen Reactions   Meperidine Other (See Comments)    Sweating    Vicodin [Hydrocodone-Acetaminophen] Nausea And Vomiting    ROS: Negative except as per HPI above  Objective:  General: AAO x3, NAD  Dermatological: Incurvation is present along the hallux lateral nail border of the right great toe. There is localized edema without any erythema or increase in warmth around the nail border. There is no drainage or pus. There is no ascending cellulitis. No malodor. No open lesions or pre-ulcerative lesions.    Vascular:  Dorsalis Pedis artery and Posterior Tibial artery pedal pulses are 2/4 bilateral.  Capillary fill time brisk < 3 sec. Pedal hair growth present. No varicosities and no  lower extremity edema present bilateral. There is no pain with calf compression, swelling, warmth, erythema.   Neruologic: Grossly intact via light touch bilateral. Protective threshold intact to all sites bilateral. Patellar and Achilles deep tendon reflexes 2+ bilateral. Negative Babinski reflex.   Musculoskeletal: Osseous prominence noted lateral midfoot right foot at the 5th met base with hyperkeratotic tissue present at that area. Pain with palpation at the 5th met base tuberosity plantarly and laterally.   Gait: Unassisted, Nonantalgic.   No images are attached to the encounter.  Radiographs:  Date: 10/11/21 XR the right foot Weightbearing AP/Lateral/Oblique   Findings: Attention directed to the fifth metatarsal base there is likely healed fracture at the fifth metatarsal metaphyseal diaphyseal junction.  On lateral view there is questionable radiolucency transversely at the area possibly indicating incomplete healing of the fracture.  There is malalignment of the fifth metatarsal at the site of the fracture.  Osseous prominence plantarly at the fifth met tuberosity is noted on exam. Assessment:   1. Ingrown nail of great toe of right foot   2. Paronychia of great toe, right   3. Bone spur of right foot      Plan:  Patient was evaluated and treated and all questions answered.   Ingrown Nail, right hallux lateral border -Patient  elects to proceed with minor surgery to remove ingrown toenail today. Consent reviewed and signed by patient. -Ingrown nail excised. See procedure note. -Educated on post-procedure care including soaking. Written instructions provided and reviewed. -Patient to follow up in 2 weeks for nail check. - Sent Rx for cephalexin 500 mg 3 times daily for 5 days for mild paronychia of the right hallux lateral border  Procedure: Excision of Ingrown Toenail Location: Right 1st toe lateral nail borders. Anesthesia: Lidocaine 1% plain; 1.5 mL and Marcaine 0.5% plain;  1.5 mL, digital block. Skin Prep: Betadine. Dressing: Silvadene; telfa; dry, sterile, compression dressing. Technique: Following skin prep, the toe was exsanguinated and a tourniquet was secured at the base of the toe. The affected nail border was freed, split with a nail splitter, and excised. Chemical matrixectomy was then performed with phenol and irrigated out with alcohol. The tourniquet was then removed and sterile dressing applied. Disposition: Patient tolerated procedure well. Patient to return in 2 weeks for follow-up.   #Prominence of the fifth metatarsal base on the right foot with underlying hyperkeratotic lesion plantar fifth met tuberosity -Discussed with the patient that it is not a bunion present on the outside of the right foot but rather prominence of the fifth metatarsal base. -I debrided all painful hyperkeratotic lesions x1 at the plantar aspect of the fifth metatarsal base with 15 blade without issue. -Discussed offloading measures as the primary method to reduce callus formation at this area.  Can use lotion or pumice stone to reduce callus formation at the area. -Surgically the option would be for partial resection of the plantar aspect of the fifth metatarsal base however this may not fully resolve the pain he is having secondary to callus formation   Return in about 2 weeks (around 10/25/2021) for follow up right ingrown.          [name], DPM [facility]

## 2021-10-25 ENCOUNTER — Ambulatory Visit: Payer: PPO | Admitting: Podiatry

## 2021-10-25 DIAGNOSIS — L600 Ingrowing nail: Secondary | ICD-10-CM | POA: Diagnosis not present

## 2021-10-25 NOTE — Progress Notes (Signed)
Subjective: Benjamin Griffin is a 70 y.o.  male returns to office today for follow up evaluation after having right Hallux lateral border nail ingrown removal with phenol and alcohol matrixectomy approximately 2 weeks ago. Patient has been soaking using epsom salt soaks and applying topical antibiotic covered with bandaid daily. Patient denies fevers, chills, nausea, vomiting. Denies any calf pain, chest pain, SOB.   Objective:  Vitals: Reviewed  General: Well developed, nourished, in no acute distress, alert and oriented x3   Dermatology: Skin is warm, dry and supple bilateral. right hallux lateral nail border appears to be clean, dry, with mild granular tissue and surrounding scab. There is no surrounding erythema, edema, drainage/purulence. The remaining nails appear unremarkable at this time. There are no other lesions or other signs of infection present.  Neurovascular status: Intact. No lower extremity swelling; No pain with calf compression bilateral.  Musculoskeletal: Decreased tenderness to palpation of the right hallux nail fold(s). Muscular strength within normal limits bilateral.   Assesement and Plan: S/p phenol and alcohol matrixectomy to the  right hallux nail  lateral border, doing well.   -Continue soaking in epsom salts twice a day followed by antibiotic ointment and a band-aid. Can leave uncovered at night. Continue this until completely healed.  -If the area has not healed in 2 weeks, call the office for follow-up appointment, or sooner if any problems arise.  -Monitor for any signs/symptoms of infection. Call the office immediately if any occur or go directly to the emergency room. Call with any questions/concerns.        [name], [place] [facility]                   10/25/2021

## 2021-11-03 DIAGNOSIS — I10 Essential (primary) hypertension: Secondary | ICD-10-CM | POA: Diagnosis not present

## 2021-11-03 DIAGNOSIS — Z8673 Personal history of transient ischemic attack (TIA), and cerebral infarction without residual deficits: Secondary | ICD-10-CM | POA: Diagnosis not present

## 2021-11-03 DIAGNOSIS — G4733 Obstructive sleep apnea (adult) (pediatric): Secondary | ICD-10-CM | POA: Diagnosis not present

## 2021-11-03 IMAGING — DX DG CHEST 2V
2 series · 2 of 2 positions shown · non-contrast
Comparison: January 06, 2020

CLINICAL DATA: Chest pain.

EXAM:
CHEST - 2 VIEW

[chest ap]
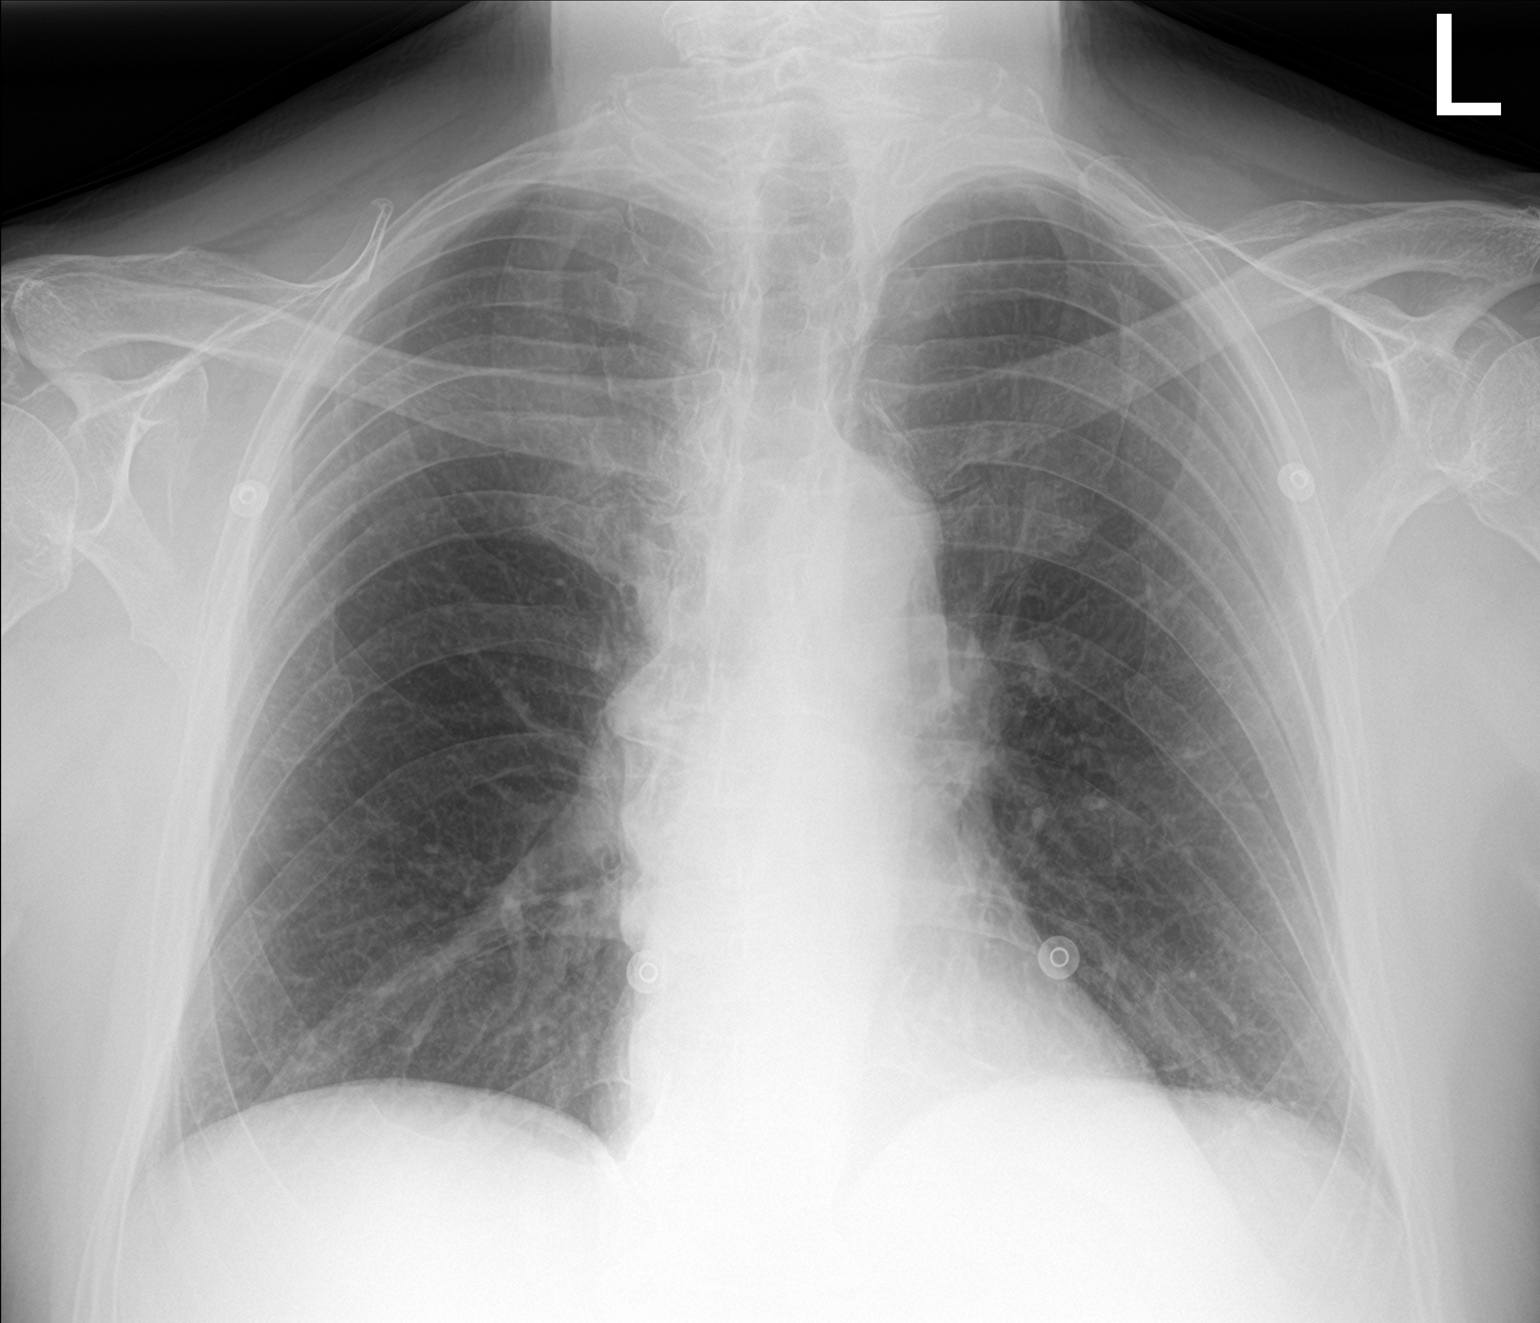

[chest lat]
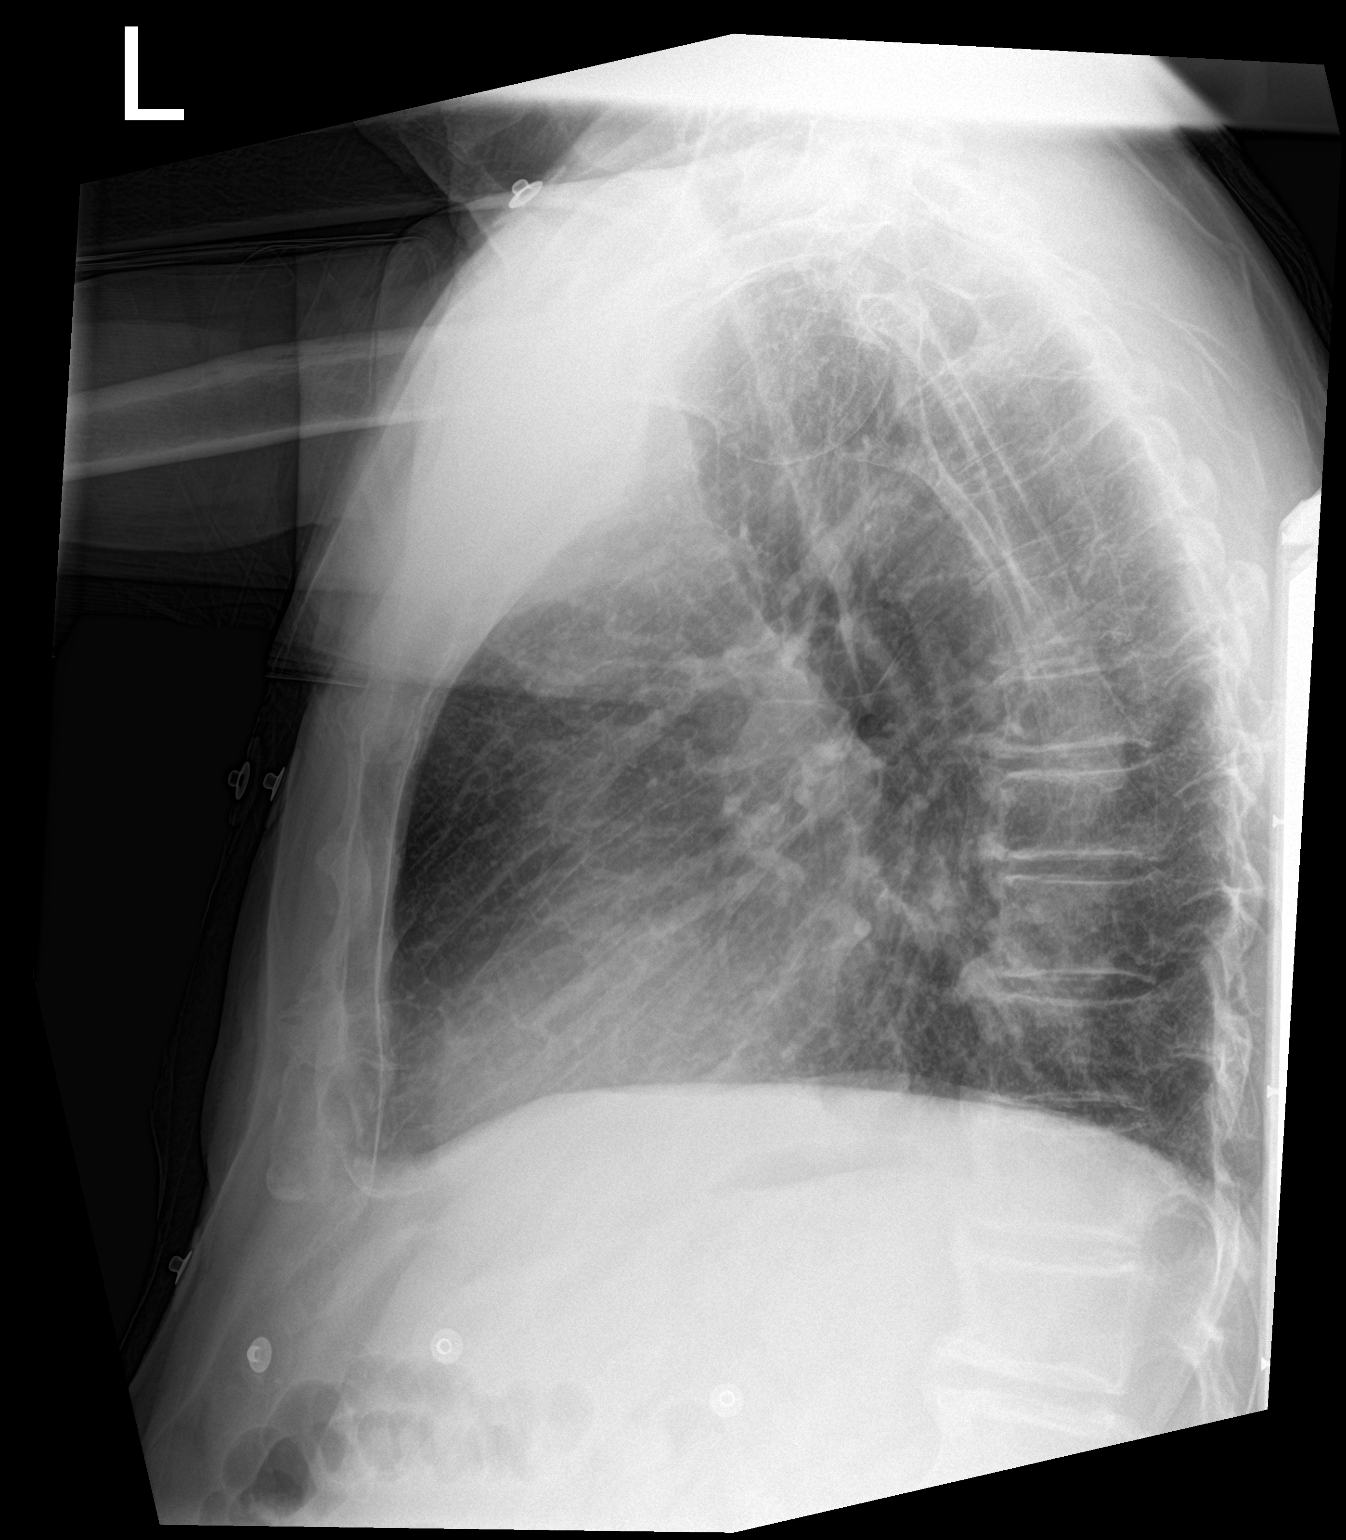

[2 of 2 positions shown; findings below may reference images not displayed]

FINDINGS: The heart size and mediastinal contours are within normal limits.
Both lungs are clear. The visualized skeletal structures are
unremarkable.
IMPRESSION: No active cardiopulmonary disease.

## 2021-11-12 ENCOUNTER — Other Ambulatory Visit: Payer: Self-pay | Admitting: Cardiology

## 2021-11-14 NOTE — Telephone Encounter (Signed)
Flecainide refill sent to pharmacy

## 2021-11-15 ENCOUNTER — Telehealth: Payer: Self-pay

## 2021-11-15 DIAGNOSIS — M8588 Other specified disorders of bone density and structure, other site: Secondary | ICD-10-CM | POA: Diagnosis not present

## 2021-11-15 DIAGNOSIS — M1711 Unilateral primary osteoarthritis, right knee: Secondary | ICD-10-CM | POA: Diagnosis not present

## 2021-11-15 DIAGNOSIS — M1712 Unilateral primary osteoarthritis, left knee: Secondary | ICD-10-CM | POA: Diagnosis not present

## 2021-11-15 DIAGNOSIS — G8929 Other chronic pain: Secondary | ICD-10-CM | POA: Diagnosis not present

## 2021-11-15 DIAGNOSIS — M170 Bilateral primary osteoarthritis of knee: Secondary | ICD-10-CM | POA: Diagnosis not present

## 2021-11-15 DIAGNOSIS — M25562 Pain in left knee: Secondary | ICD-10-CM | POA: Diagnosis not present

## 2021-11-15 DIAGNOSIS — M25561 Pain in right knee: Secondary | ICD-10-CM | POA: Diagnosis not present

## 2021-11-15 NOTE — Telephone Encounter (Signed)
   Pre-operative Risk Assessment    Patient Name: Benjamin Griffin  DOB: 04-25-1950 MRN: [id number]      Request for Surgical Clearance    Procedure:   Right total knee replacement  Date of Surgery:  Clearance TBD                                 Surgeon:  Lara Mulch, MD Surgeon's Group or Practice Name:  [facility]  Phone number:  [phone] Fax number:  [phone] and [phone]   Type of Clearance Requested:   - Medical    Type of Anesthesia:  Spinal   Additional requests/questions:  Please fax a copy of form to the surgeon's office.  [name]   11/15/2021, 12:54 PM

## 2021-11-15 NOTE — Telephone Encounter (Signed)
Pt is returning call and is requesting call back.  

## 2021-11-15 NOTE — Telephone Encounter (Signed)
Left message to call back to schedule tele pre op appt.  

## 2021-11-15 NOTE — Telephone Encounter (Signed)
I s/w the pt and he is going to wait until he see's Dr. Bettina Gavia 01/03/22 for pre op clearance. He states surgery will not be until sometime in Jan 2024.

## 2021-11-15 NOTE — Telephone Encounter (Signed)
Prescription refill request for Eliquis received. Indication: PAF Last office visit: 04/26/21  Rinaldo Cloud MD Scr: 0.95 on 10/05/20  Age: 71 Weight: 88.7kg  Based on above findings Eliquis '5mg'$  twice daily is the appropriate dose.  Refill approved. Requested CBC/BMP at upcoming scheduled OV.

## 2021-11-15 NOTE — Telephone Encounter (Signed)
   Name: Benjamin Griffin  DOB: Nov 20, 1950  MRN: [id number]  Primary Cardiologist: Shirlee More, MD  Chart reviewed as part of pre-operative protocol coverage. Because of [name]'s past medical history and time since last visit, he will require a follow-up telephone visit in order to better assess preoperative cardiovascular risk.  Pre-op covering staff: - Please schedule appointment and call patient to inform them. If patient already had an upcoming appointment within acceptable timeframe, please add "pre-op clearance" to the appointment notes so provider is aware. - Please contact requesting surgeon's office via preferred method (i.e, phone, fax) to inform them of need for appointment prior to surgery.  No medications indicated as needing held.  Elgie Collard, PA-C  11/15/2021, 2:08 PM

## 2021-11-22 ENCOUNTER — Telehealth: Payer: Self-pay | Admitting: Cardiology

## 2021-11-22 DIAGNOSIS — Z23 Encounter for immunization: Secondary | ICD-10-CM | POA: Diagnosis not present

## 2021-11-22 NOTE — Telephone Encounter (Signed)
Pt will come pickup samples and complete pt assistance forms.

## 2021-11-22 NOTE — Telephone Encounter (Signed)
Patient calling the office for samples of medication:   1.  What medication and dosage are you requesting samples for? ELIQUIS 5 MG TABS tablet   2.  Are you currently out of this medication? Bought enough for 45 days, but they were charging over $400 dollars for this. He is requesting a call back to see if he can get samples for when he runs out to last until the remainder of the year.

## 2021-11-28 MED ORDER — APIXABAN 5 MG PO TABS: 5.0000 mg | ORAL_TABLET | Freq: Two times a day (BID) | ORAL | 3 refills | Status: DC

## 2021-11-28 NOTE — Addendum Note (Signed)
Addended by: Darius Bump B on: 11/28/2021 10:01 AM   Modules accepted: Orders

## 2021-11-28 NOTE — Telephone Encounter (Signed)
[facility] patient assistance form completed and returned by patient. Provider portion of form completed and faxed with new Rx   Outcome pending

## 2021-12-02 NOTE — Telephone Encounter (Signed)
Received approval for Eliquis assistance from [employer] for 11/30/21 through 01/15/2022. Patient made aware. Copy of approval letter in chart media

## 2021-12-06 ENCOUNTER — Other Ambulatory Visit: Payer: Self-pay

## 2021-12-06 MED ORDER — APIXABAN 5 MG PO TABS: 5.0000 mg | ORAL_TABLET | Freq: Two times a day (BID) | ORAL | 3 refills | Status: DC

## 2021-12-06 NOTE — Progress Notes (Addendum)
Renewal application for [employer] for Eliquis assistance needed.Patient notified and will come to office to sign application New Rx printed to fax with renewal application

## 2021-12-07 ENCOUNTER — Ambulatory Visit: Payer: PPO | Admitting: Pulmonary Disease

## 2021-12-07 ENCOUNTER — Encounter: Payer: Self-pay | Admitting: Pulmonary Disease

## 2021-12-07 VITALS — BP 150/80 | HR 62 | Temp 97.9°F | Ht 72.0 in | Wt 202.4 lb

## 2021-12-07 DIAGNOSIS — G4733 Obstructive sleep apnea (adult) (pediatric): Secondary | ICD-10-CM

## 2021-12-07 NOTE — Patient Instructions (Signed)
I will see you a year from now  Continue using CPAP on a nightly basis  The compliance from the machine shows it is working very well

## 2021-12-07 NOTE — Progress Notes (Signed)
Axiel Fjeld    [id number]    May 17, 1950  Primary Care Physician:Holt, Gara Kroner, MD  Referring Physician: Ronita Hipps, MD [place] [id number],   Chief complaint:   Patient with mild obstructive sleep apnea CPAP is working well  HPI:  Doing well with CPAP Sleeping better Functioning better Tolerating CPAP well  Waking up feeling like he is at a good nights rest  He has a history of coronary artery disease, history of stroke and atrial fibrillation  Feels well rested on a day-to-day basis  reformed smoker No pertinent occupational history   Outpatient Encounter Medications as of 12/07/2021  Medication Sig   amLODipine (NORVASC) 5 MG tablet Take 1 tablet by mouth once daily   apixaban (ELIQUIS) 5 MG TABS tablet Take 1 tablet (5 mg total) by mouth 2 (two) times daily.   Choline Fenofibrate (FENOFIBRIC ACID) 135 MG CPDR Take 135 mg by mouth daily.   fenofibrate micronized (LOFIBRA) 134 MG capsule    flecainide (TAMBOCOR) 50 MG tablet Take 1 tablet by mouth twice daily   metoprolol succinate (TOPROL-XL) 50 MG 24 hr tablet Take 0.5 tablets (25 mg total) by mouth daily.   [DISCONTINUED] apixaban (ELIQUIS) 5 MG TABS tablet Take 1 tablet (5 mg total) by mouth 2 (two) times daily.   [DISCONTINUED] ELIQUIS 5 MG TABS tablet Take 1 tablet by mouth twice daily   No facility-administered encounter medications on file as of 12/07/2021.    Allergies as of 12/07/2021 - Review Complete 12/07/2021  Allergen Reaction Noted   Meperidine Other (See Comments) 06/03/2019   Vicodin [hydrocodone-acetaminophen] Nausea And Vomiting 12/24/2019    Past Medical History:  Diagnosis Date   Arthritis    Cancer ([place])    skin cancer, removed from bilateral arms   History of kidney stones    Hyperlipidemia    Hypertension    Palpitations    Sleep apnea    CPAP   Stroke ([place])    approx 2000, on plavix. When found on CT, was told it was an "old stroke"     Past Surgical History:  Procedure Laterality Date   APPENDECTOMY     CARDIAC CATHETERIZATION     approx year 2000 @ [place] after stroke   CATARACT EXTRACTION, BILATERAL     EYE SURGERY     HERNIA REPAIR     LEFT HEART CATH AND CORONARY ANGIOGRAPHY N/A 10/04/2020   Procedure: LEFT HEART CATH AND CORONARY ANGIOGRAPHY;  Surgeon: Early Osmond, MD;  Location: [facility];  Service: Cardiovascular;  Laterality: N/A;   LOOP RECORDER INSERTION     approx 2000    LOOP RECORDER REMOVAL     1 week after instertion, approx 2000   NASAL SINUS SURGERY     TRANSFORAMINAL LUMBAR INTERBODY FUSION W/ MIS 1 LEVEL N/A 01/01/2020   Procedure: TRANSFORAMINAL LUMBAR INTERBODY FUSION (TLIF) L4-L5;  Surgeon: Vallarie Mare, MD;  Location: [place];  Service: Neurosurgery;  Laterality: N/A;  TRANSFORAMINAL LUMBAR INTERBODY FUSION (TLIF) X5-T7    UMBILICAL HERNIA REPAIR      Family History  Problem Relation Age of Onset   Lymphoma Mother    Atrial fibrillation Mother    Liver cancer Father    Hypertension Sister    Hypertension Brother    Hypertension Brother    Hypertension Brother    Hypertension Brother    Hypertension Sister    Hypertension Sister  Social History   Socioeconomic History   Marital status: Married    Spouse name: Not on file   Number of children: Not on file   Years of education: Not on file   Highest education level: Not on file  Occupational History   Not on file  Tobacco Use   Smoking status: Former    Packs/day: 0.50    Years: 25.00    Total pack years: 12.50    Types: Cigarettes    Quit date: 12/17/1998    Years since quitting: 22.9   Smokeless tobacco: Never   Tobacco comments:    quit in 2000  Vaping Use   Vaping Use: Never used  Substance and Sexual Activity   Alcohol use: Not Currently    Comment: 1 beer occasionally   Drug use: Never   Sexual activity: Not on file  Other Topics Concern   Not on file  Social History Narrative    ** Merged History Encounter **       Social Determinants of Health   Financial Resource Strain: Not on file  Food Insecurity: Not on file  Transportation Needs: Not on file  Physical Activity: Not on file  Stress: Not on file  Social Connections: Not on file  Intimate Partner Violence: Not on file    Review of Systems  Constitutional:  Negative for fatigue.  Respiratory:  Positive for apnea. Negative for shortness of breath.   Psychiatric/Behavioral:  Positive for sleep disturbance.     There were no vitals filed for this visit.    Physical Exam Constitutional:      Appearance: He is obese.  HENT:     Head: Normocephalic and atraumatic.     Nose: Nose normal.     Mouth/Throat:     Mouth: Mucous membranes are moist.     Comments: Mallampati 3, crowded oropharynx Eyes:     General:        Right eye: No discharge.        Left eye: No discharge.     Pupils: Pupils are equal, round, and reactive to light.  Cardiovascular:     Rate and Rhythm: Normal rate and regular rhythm.     Heart sounds: No murmur heard.    No friction rub.  Pulmonary:     Effort: No respiratory distress.     Breath sounds: No stridor. No wheezing or rhonchi.  Musculoskeletal:     Cervical back: No tenderness.  Neurological:     Mental Status: He is alert.  Psychiatric:        Mood and Affect: Mood normal.   Data Reviewed: Sleep study results showing mild obstructive sleep apnea  Download from machine shows excellent compliance with CPAP Machine set at 8 Residual AHI of 0.8  Assessment:  Mild obstructive sleep apnea -Compliant with CPAP use  History of hypertension History of atrial fibrillation History of CVA  Plan/Recommendations:  Continue CPAP nightly  Encouraged to call with significant concerns  Follow-up in a year  Sherrilyn Rist MD [facility] 12/07/2021, 1:12 PM  CC: Ronita Hipps, MD

## 2022-01-03 ENCOUNTER — Encounter: Payer: Self-pay | Admitting: Cardiology

## 2022-01-03 ENCOUNTER — Ambulatory Visit: Payer: PPO | Attending: Cardiology | Admitting: Cardiology

## 2022-01-03 VITALS — BP 150/78 | HR 56 | Ht 72.0 in | Wt 208.0 lb

## 2022-01-03 DIAGNOSIS — I25110 Atherosclerotic heart disease of native coronary artery with unstable angina pectoris: Secondary | ICD-10-CM | POA: Diagnosis not present

## 2022-01-03 DIAGNOSIS — I480 Paroxysmal atrial fibrillation: Secondary | ICD-10-CM

## 2022-01-03 DIAGNOSIS — Z7901 Long term (current) use of anticoagulants: Secondary | ICD-10-CM | POA: Diagnosis not present

## 2022-01-03 DIAGNOSIS — Z79899 Other long term (current) drug therapy: Secondary | ICD-10-CM | POA: Diagnosis not present

## 2022-01-03 DIAGNOSIS — E782 Mixed hyperlipidemia: Secondary | ICD-10-CM | POA: Diagnosis not present

## 2022-01-03 DIAGNOSIS — I110 Hypertensive heart disease with heart failure: Secondary | ICD-10-CM

## 2022-01-03 MED ORDER — LOSARTAN POTASSIUM-HCTZ 50-12.5 MG PO TABS: 1.0000 | ORAL_TABLET | Freq: Every day | ORAL | 3 refills | Status: DC

## 2022-01-03 MED ORDER — FLECAINIDE ACETATE 50 MG PO TABS: 50.0000 mg | ORAL_TABLET | Freq: Two times a day (BID) | ORAL | 3 refills | Status: DC

## 2022-01-03 MED ORDER — AMLODIPINE BESYLATE 5 MG PO TABS: 5.0000 mg | ORAL_TABLET | Freq: Every day | ORAL | 3 refills | Status: AC

## 2022-01-03 MED ORDER — CARVEDILOL 6.25 MG PO TABS: 6.2500 mg | ORAL_TABLET | Freq: Two times a day (BID) | ORAL | 3 refills | Status: DC

## 2022-01-03 NOTE — Progress Notes (Addendum)
Cardiology Office Note:    Date:  01/03/2022   ID:  Benjamin Griffin, DOB 05-29-1950, MRN [id number]  PCP:  Ronita Hipps, MD  Cardiologist:  Shirlee More, MD    Referring MD: Ronita Hipps, MD    03/17/2022: Blood pressure log brought to the office well-controlled as stated previously he is optimized and can proceed with his planned orthopedic surgery.   ASSESSMENT:    1. Paroxysmal atrial fibrillation (HCC)   2. High risk medication use   3. Chronic anticoagulation   4. Hypertensive heart disease with heart failure ([place])   5. Mixed hyperlipidemia   6. Coronary artery disease involving native coronary artery of native heart with unstable angina pectoris (HCC)    PLAN:    In order of problems listed above:  Stable maintaining sinus rhythm continue flecainide and low-dose anticoagulant Blood pressure poorly controlled will reduce amlodipine with his peripheral edema switch Toprol week to carvedilol potent antihypertensive and had diuretic ARB if trend blood pressures for 2 weeks prior to the office and we will bring him back and check renal function at that time if blood pressure at target proceed with his planned surgery Continue statin we will also check a lipid profile in 2 weeks Stable CAD optimized for his planned right total knee arthroplasty.  He could simply withdrawal his anticoagulant prior to surgical procedure   Next appointment: 6 weeks   Medication Adjustments/Labs and Tests Ordered: Current medicines are reviewed at length with the patient today.  Concerns regarding medicines are outlined above.  No orders of the defined types were placed in this encounter.  No orders of the defined types were placed in this encounter.   Chief complaint follow-up atrial fibrillation also anticipate knee surgery.My blood pressure   History of Present Illness:    Benjamin Griffin is a 71 y.o. male with a hx of paroxysmal atrial fibrillation maintaining sinus rhythm with  low-dose flecainide with chronic anticoagulation hypertensive heart disease dyslipidemia and CAD with non-ST elevation MI in September 2022 left heart catheterization showed no obstructive CAD in the proximal right coronary artery and LAD with significant elevation in high-sensitivity troponin.  He was last seen 04/26/2021 and in retrospect his clinical event was most consistent with Takotsubo cardiomyopathy.  Compliance with diet, lifestyle and medications: Yes  Is a complex visit he anticipates total knee arthroplasty in the near future His blood pressure has not been well-controlled he had an increase in the dose of the calcium channel blocker in the summer which does not seem to have affected blood pressures often running 1 [id number] systolic and now he has developed peripheral edema on higher dose His son is an anesthesiologist in [place] His echocardiogram shows recovery of left ventricular function Despite his bilateral knee pain he remains active and is not having angina shortness of breath palpitation or syncope He has had no bleeding complication from his anticoagulant  EchoCardiogram performed 05/04/2021.  Left ventricle showed mild concentric LVH normal size normal systolic function echo EF 60-65 normal left ventricular GLS he was felt to have grade 2 diastolic dysfunction right ventricle normal size and function without elevated pulmonary artery pressures left atrium moderately enlarged.   1. Left ventricular ejection fraction, by estimation, is 60 to 65%. The  left ventricle has normal function. The left ventricle has no regional  wall motion abnormalities. There is mild concentric left ventricular  hypertrophy. Left ventricular diastolic  parameters are consistent with Grade I diastolic dysfunction (impaired  relaxation). Elevated  left ventricular end-diastolic pressure. The average  left ventricular global longitudinal strain is -20.1 %. The global  longitudinal strain is normal.    2. Right ventricular systolic function is normal. The right ventricular  size is normal. There is mildly elevated pulmonary artery systolic  pressure.   3. Left atrial size was moderately dilated.   4. The mitral valve is normal in structure. No evidence of mitral valve  regurgitation. No evidence of mitral stenosis.   5. The aortic valve is tricuspid. Aortic valve regurgitation is mild.  Aortic valve sclerosis is present, with no evidence of aortic valve  stenosis.   6. The inferior vena cava is normal in size with greater than 50%  respiratory variability, suggesting right atrial pressure of 3 mmHg. Past Medical History:  Diagnosis Date   Arthritis    Cancer ([place])    skin cancer, removed from bilateral arms   History of kidney stones    Hyperlipidemia    Hypertension    Palpitations    Sleep apnea    CPAP   Stroke ([place])    approx 2000, on plavix. When found on CT, was told it was an "old stroke"    Past Surgical History:  Procedure Laterality Date   APPENDECTOMY     CARDIAC CATHETERIZATION     approx year 2000 @ [facility] after stroke   CATARACT EXTRACTION, BILATERAL     EYE SURGERY     HERNIA REPAIR     LEFT HEART CATH AND CORONARY ANGIOGRAPHY N/A 10/04/2020   Procedure: LEFT HEART CATH AND CORONARY ANGIOGRAPHY;  Surgeon: Early Osmond, MD;  Location: [facility];  Service: Cardiovascular;  Laterality: N/A;   LOOP RECORDER INSERTION     approx 2000    LOOP RECORDER REMOVAL     1 week after instertion, approx 2000   NASAL SINUS SURGERY     TRANSFORAMINAL LUMBAR INTERBODY FUSION W/ MIS 1 LEVEL N/A 01/01/2020   Procedure: TRANSFORAMINAL LUMBAR INTERBODY FUSION (TLIF) L4-L5;  Surgeon: Vallarie Mare, MD;  Location: [facility];  Service: Neurosurgery;  Laterality: N/A;  TRANSFORAMINAL LUMBAR INTERBODY FUSION (TLIF) [id number]    UMBILICAL HERNIA REPAIR      Current Medications: Current Meds  Medication Sig   amLODipine (NORVASC) 10 MG tablet Take 10 mg by mouth  daily.   apixaban (ELIQUIS) 5 MG TABS tablet Take 1 tablet (5 mg total) by mouth 2 (two) times daily.   fenofibrate micronized (LOFIBRA) 134 MG capsule Take 134 mg by mouth daily.   flecainide (TAMBOCOR) 50 MG tablet Take 1 tablet by mouth twice daily   metoprolol succinate (TOPROL-XL) 50 MG 24 hr tablet Take 0.5 tablets (25 mg total) by mouth daily. (Patient taking differently: Take 50 mg by mouth daily.)   simvastatin (ZOCOR) 20 MG tablet Take 20 mg by mouth daily.     Allergies:   Meperidine and Vicodin [hydrocodone-acetaminophen]   Social History   Socioeconomic History   Marital status: Married    Spouse name: Not on file   Number of children: Not on file   Years of education: Not on file   Highest education level: Not on file  Occupational History   Not on file  Tobacco Use   Smoking status: Former    Packs/day: 0.50    Years: 25.00    Total pack years: 12.50    Types: Cigarettes    Quit date: 12/17/1998    Years since quitting: 23.0   Smokeless tobacco:  Never   Tobacco comments:    quit in 2000  Vaping Use   Vaping Use: Never used  Substance and Sexual Activity   Alcohol use: Not Currently    Comment: 1 beer occasionally   Drug use: Never   Sexual activity: Not on file  Other Topics Concern   Not on file  Social History Narrative   ** Merged History Encounter **       Social Determinants of Health   Financial Resource Strain: Not on file  Food Insecurity: Not on file  Transportation Needs: Not on file  Physical Activity: Not on file  Stress: Not on file  Social Connections: Not on file     Family History: The patient's family history includes Atrial fibrillation in his mother; Hypertension in his brother, brother, brother, brother, sister, sister, and sister; Liver cancer in his father; Lymphoma in his mother. ROS:   Please see the history of present illness.    All other systems reviewed and are negative.  EKGs/Labs/Other Studies Reviewed:    The  following studies were reviewed today:  EKG:  EKG ordered today and personally reviewed.  The ekg ordered today demonstrates sinus rhythm 56 bpm first-degree AV block otherwise normal EKG  Recent Labs:  Recent Lipid Panel    Component Value Date/Time   CHOL 107 10/04/2020 0936   TRIG 89 10/04/2020 0936   HDL 32 (L) 10/04/2020 0936   CHOLHDL 3.3 10/04/2020 0936   VLDL 18 10/04/2020 0936   LDLCALC 57 10/04/2020 0936    Physical Exam:    VS:  BP (!) 150/78 (BP Location: Right Arm, Patient Position: Sitting)   Pulse (!) 56   Ht 6' (1.829 m)   Wt 208 lb (94.3 kg)   SpO2 96%   BMI 28.21 kg/m     Wt Readings from Last 3 Encounters:  01/03/22 208 lb (94.3 kg)  12/07/21 202 lb 6.4 oz (91.8 kg)  06/10/21 195 lb (88.5 kg)     GEN:  Well nourished, well developed in no acute distress HEENT: Normal NECK: No JVD; No carotid bruits LYMPHATICS: No lymphadenopathy CARDIAC: RRR, no murmurs, rubs, gallops RESPIRATORY:  Clear to auscultation without rales, wheezing or rhonchi  ABDOMEN: Soft, non-tender, non-distended MUSCULOSKELETAL: Bilateral distal pitting calcium channel blocker edema; No deformity  SKIN: Warm and dry NEUROLOGIC:  Alert and oriented x 3 PSYCHIATRIC:  Normal affect    Signed, Shirlee More, MD  01/03/2022 3:18 PM    [facility]

## 2022-01-03 NOTE — Patient Instructions (Addendum)
Medication Instructions:  Your physician has recommended you make the following change in your medication:  Decrease Amlodipine to 5 mg once daily Discontinue Metoprolol Start Carvedilol 6.25 mg two times daily Start Hyzar 50-12.5 mg once daily  *If you need a refill on your cardiac medications before your next appointment, please call your pharmacy*   Lab Work: Your physician recommends that you return for lab work in: 2 weeks for Lipid Panel and BMP Lab opens at [facility] an appointment. Best time to come is between 8am and 12noon and between 1:30 and 4:30. If you have been asked to fast for your blood work please have nothing to eat or drink after midnight. You may have water.   If you have labs (blood work) drawn today and your tests are completely normal, you will receive your results only by: [place] (if you have MyChart) OR A paper copy in the mail If you have any lab test that is abnormal or we need to change your treatment, we will call you to review the results.   Testing/Procedures: NONE   Follow-Up: At [facility], you and your health needs are our priority.  As part of our continuing mission to provide you with exceptional heart care, we have created designated Provider Care Teams.  These Care Teams include your primary Cardiologist (physician) and Advanced Practice Providers (APPs -  Physician Assistants and Nurse Practitioners) who all work together to provide you with the care you need, when you need it.  We recommend signing up for the patient portal called "MyChart".  Sign up information is provided on this After Visit Summary.  MyChart is used to connect with patients for Virtual Visits (Telemedicine).  Patients are able to view lab/test results, encounter notes, upcoming appointments, etc.  Non-urgent messages can be sent to your provider as well.   To learn more about what you can do with MyChart, go to [email or web address].    Your  next appointment:   6 month(s)  The format for your next appointment:   In Person  Provider:   Shirlee More, MD    Other Instructions Buy an Omron BP Cuff online to track BP at home  Important Information About Sugar      [employer]  Tips to measure your blood pressure correctly  To determine whether you have hypertension, a medical professional will take a blood pressure reading. How you prepare for the test, the position of your arm, and other factors can change a blood pressure reading by 10% or more. That could be enough to hide high blood pressure, start you on a drug you don't really need, or lead your doctor to incorrectly adjust your medications. National and international guidelines offer specific instructions for measuring blood pressure. If a doctor, nurse, or medical assistant isn't doing it right, don't hesitate to ask him or her to get with the guidelines. Here's what you can do to ensure a correct reading:  Don't drink a caffeinated beverage or smoke during the 30 minutes before the test.  Sit quietly for five minutes before the test begins.  During the measurement, sit in a chair with your feet on the floor and your arm supported so your elbow is at about heart level.  The inflatable part of the cuff should completely cover at least 80% of your upper arm, and the cuff should be placed on bare skin, not over a shirt.  Don't talk during the measurement.  Have your  blood pressure measured twice, with a brief break in between. If the readings are different by 5 points or more, have it done a third time. There are times to break these rules. If you sometimes feel lightheaded when getting out of bed in the morning or when you stand after sitting, you should have your blood pressure checked while seated and then while standing to see if it falls from one position to the next. Because blood pressure varies throughout the day, your doctor will rarely diagnose hypertension on the  basis of a single reading. Instead, he or she will want to confirm the measurements on at least two occasions, usually within a few weeks of one another. The exception to this rule is if you have a blood pressure reading of 180/110 mm Hg or higher. A result this high usually calls for prompt treatment. It's also a good idea to have your blood pressure measured in both arms at least once, since the reading in one arm (usually the right) may be higher than that in the left. A 2014 study in The American Journal of Medicine of nearly 3,400 people found average arm- to-arm differences in systolic blood pressure of about 5 points. The higher number should be used to make treatment decisions. In 2017, new guidelines from the [place], the [employer] of Cardiology, and nine other health organizations lowered the diagnosis of high blood pressure to 130/80 mm Hg or higher for all adults. The guidelines also redefined the various blood pressure categories to now include normal, elevated, Stage 1 hypertension, Stage 2 hypertension, and hypertensive crisis (see "Blood pressure categories"). Blood pressure categories  Blood pressure category SYSTOLIC (upper number)  DIASTOLIC (lower number)  Normal Less than 120 mm Hg and Less than 80 mm Hg  Elevated 120-129 mm Hg and Less than 80 mm Hg  High blood pressure: Stage 1 hypertension 130-139 mm Hg or 80-89 mm Hg  High blood pressure: Stage 2 hypertension 140 mm Hg or higher or 90 mm Hg or higher  Hypertensive crisis (consult your doctor immediately) Higher than 180 mm Hg and/or Higher than 120 mm Hg  Source: American Heart Association and American Stroke Association. For more on getting your blood pressure under control, buy Controlling Your Blood Pressure, a Special Health Report from [facility].   Purchase a new Omron BP device

## 2022-01-05 DIAGNOSIS — G4733 Obstructive sleep apnea (adult) (pediatric): Secondary | ICD-10-CM | POA: Diagnosis not present

## 2022-01-18 DIAGNOSIS — I480 Paroxysmal atrial fibrillation: Secondary | ICD-10-CM | POA: Diagnosis not present

## 2022-01-18 DIAGNOSIS — Z7901 Long term (current) use of anticoagulants: Secondary | ICD-10-CM | POA: Diagnosis not present

## 2022-01-18 DIAGNOSIS — Z79899 Other long term (current) drug therapy: Secondary | ICD-10-CM | POA: Diagnosis not present

## 2022-01-18 DIAGNOSIS — E782 Mixed hyperlipidemia: Secondary | ICD-10-CM | POA: Diagnosis not present

## 2022-01-18 DIAGNOSIS — I25110 Atherosclerotic heart disease of native coronary artery with unstable angina pectoris: Secondary | ICD-10-CM | POA: Diagnosis not present

## 2022-01-18 DIAGNOSIS — I110 Hypertensive heart disease with heart failure: Secondary | ICD-10-CM | POA: Diagnosis not present

## 2022-01-18 LAB — LIPID PANEL
Chol/HDL Ratio: 3.4 ratio (ref 0.0–5.0)
Cholesterol, Total: 106 mg/dL (ref 100–199)
HDL: 31 mg/dL — ABNORMAL LOW (ref >39)
LDL Chol Calc (NIH): 54 mg/dL (ref 0–99)
Triglycerides: 110 mg/dL (ref 0–149)
VLDL Cholesterol Cal: 21 mg/dL (ref 5–40)

## 2022-01-18 LAB — BASIC METABOLIC PANEL
BUN/Creatinine Ratio: 17 (ref 10–24)
BUN: 16 mg/dL (ref 8–27)
CO2: 25 mmol/L (ref 20–29)
Calcium: 9.9 mg/dL (ref 8.6–10.2)
Chloride: 102 mmol/L (ref 96–106)
Creatinine, Ser: 0.92 mg/dL (ref 0.76–1.27)
Glucose: 122 mg/dL — ABNORMAL HIGH (ref 70–99)
Potassium: 3.9 mmol/L (ref 3.5–5.2)
Sodium: 138 mmol/L (ref 134–144)
eGFR: 89 mL/min/{1.73_m2} (ref >59)

## 2022-01-19 DIAGNOSIS — M545 Low back pain, unspecified: Secondary | ICD-10-CM | POA: Diagnosis not present

## 2022-01-19 DIAGNOSIS — Z6828 Body mass index (BMI) 28.0-28.9, adult: Secondary | ICD-10-CM | POA: Diagnosis not present

## 2022-02-03 DIAGNOSIS — Z8673 Personal history of transient ischemic attack (TIA), and cerebral infarction without residual deficits: Secondary | ICD-10-CM | POA: Diagnosis not present

## 2022-02-03 DIAGNOSIS — I10 Essential (primary) hypertension: Secondary | ICD-10-CM | POA: Diagnosis not present

## 2022-02-03 DIAGNOSIS — G4733 Obstructive sleep apnea (adult) (pediatric): Secondary | ICD-10-CM | POA: Diagnosis not present

## 2022-02-05 DIAGNOSIS — G4733 Obstructive sleep apnea (adult) (pediatric): Secondary | ICD-10-CM | POA: Diagnosis not present

## 2022-02-20 ENCOUNTER — Telehealth: Payer: Self-pay

## 2022-02-20 ENCOUNTER — Other Ambulatory Visit: Payer: Self-pay

## 2022-02-20 MED ORDER — VALSARTAN-HYDROCHLOROTHIAZIDE 160-12.5 MG PO TABS: 1.0000 | ORAL_TABLET | Freq: Every day | ORAL | 3 refills | Status: DC

## 2022-02-20 NOTE — Telephone Encounter (Signed)
Received the following message from Dr. Bettina Gavia:  "His blood pressure remains elevated lets switch him from losartan hydrochlorothiazide to valsartan 160 hydrochlorothiazide 12.5."  Called patient and informed his wife of the medication change. Patient's wife had no further questions at this time.

## 2022-03-08 ENCOUNTER — Telehealth: Payer: Self-pay | Admitting: Cardiology

## 2022-03-08 DIAGNOSIS — G4733 Obstructive sleep apnea (adult) (pediatric): Secondary | ICD-10-CM | POA: Diagnosis not present

## 2022-03-08 NOTE — Telephone Encounter (Signed)
Called patient and he reported that he bought an Omron blood pressure device and has been using it to take his blood pressures. He is taking his blood pressures twice daily after his blood pressure medication was recently changed. I asked him to send [place] a list of his blood pressures to [place] so Dr. Bettina Gavia could evaluate them in order to see if he can be cleared for knee surgery. Patient was agreeable with this plan and had no further questions at this time.

## 2022-03-08 NOTE — Telephone Encounter (Signed)
Patient stated he bought a new BP machine and his BP has been averaging 141/70.  Patient would like to know when Dr. Bettina Gavia thinks he can be cleared to have his knee surgery.

## 2022-03-14 DIAGNOSIS — L570 Actinic keratosis: Secondary | ICD-10-CM | POA: Diagnosis not present

## 2022-03-16 ENCOUNTER — Telehealth: Payer: Self-pay | Admitting: Cardiology

## 2022-03-16 NOTE — Telephone Encounter (Signed)
Spoke with spouse- per DPR- she stated that she texted the list of pts blood pressure readings last week. No one received those readings. Called and ask spouse to bring a copy of those readings for Dr. Bettina Gavia to see. She stated that they would stop by today.

## 2022-03-16 NOTE — Telephone Encounter (Signed)
Patient called stating his blood pressure is now under control. He is wondering if Dr. Bettina Gavia has cleared him to have knee surgery.

## 2022-03-17 NOTE — Telephone Encounter (Signed)
Last office note sent to [name] via Epic text.

## 2022-03-22 ENCOUNTER — Telehealth: Payer: Self-pay

## 2022-03-22 DIAGNOSIS — M545 Low back pain, unspecified: Secondary | ICD-10-CM | POA: Diagnosis not present

## 2022-03-22 DIAGNOSIS — Z6826 Body mass index (BMI) 26.0-26.9, adult: Secondary | ICD-10-CM | POA: Diagnosis not present

## 2022-03-22 DIAGNOSIS — Z125 Encounter for screening for malignant neoplasm of prostate: Secondary | ICD-10-CM | POA: Diagnosis not present

## 2022-03-22 DIAGNOSIS — I10 Essential (primary) hypertension: Secondary | ICD-10-CM | POA: Diagnosis not present

## 2022-03-22 DIAGNOSIS — E7800 Pure hypercholesterolemia, unspecified: Secondary | ICD-10-CM | POA: Diagnosis not present

## 2022-03-22 DIAGNOSIS — Z79899 Other long term (current) drug therapy: Secondary | ICD-10-CM | POA: Diagnosis not present

## 2022-03-22 NOTE — Telephone Encounter (Signed)
Letter received from [employer] patient was denied Eliquis assistance. Copy of letter placed in chart media and per company copy of letter was mailed to patient as well

## 2022-03-23 ENCOUNTER — Telehealth: Payer: Self-pay

## 2022-03-23 NOTE — Telephone Encounter (Signed)
   Pre-operative Risk Assessment    Patient Name: Benjamin Griffin  DOB: 1950/06/28 MRN: [id number]      Request for Surgical Clearance    Procedure:   Major Orthopaedic Procedure  Date of Surgery:  Clearance TBD                                 Surgeon:  Dr Vickey Huger Surgeon's Group or Practice Name:  [facility] Phone number:  [phone] Fax number:  [phone]   Type of Clearance Requested:   - Medical    Type of Anesthesia:  Not Indicated   Additional requests/questions:  Please fax a copy of signed pre-op clearance form and necessary records  to the surgeon's office.  Signed, Toni Arthurs   03/23/2022, 10:18 AM

## 2022-03-23 NOTE — Telephone Encounter (Signed)
   Primary Cardiologist: Shirlee More, MD  Chart reviewed as part of pre-operative protocol coverage. Given past medical history and time since last visit, based on ACC/AHA guidelines, Benjamin Griffin would be at acceptable risk for the planned procedure without further cardiovascular testing.   Patient was advised that if he develops new symptoms prior to surgery to contact our office to arrange a follow-up appointment. He verbalized understanding.  I will route this recommendation to the requesting party via Epic fax function and remove from pre-op pool.  Please call with questions.  Emmaline Life, NP-C  03/23/2022, 10:45 AM [address], [address] Office [phone] Fax [phone]

## 2022-04-06 DIAGNOSIS — G4733 Obstructive sleep apnea (adult) (pediatric): Secondary | ICD-10-CM | POA: Diagnosis not present

## 2022-04-10 ENCOUNTER — Telehealth: Payer: Self-pay

## 2022-04-10 ENCOUNTER — Other Ambulatory Visit: Payer: Self-pay

## 2022-04-10 MED ORDER — APIXABAN 5 MG PO TABS: 5.0000 mg | ORAL_TABLET | Freq: Two times a day (BID) | ORAL | 0 refills | Status: DC

## 2022-04-10 MED ORDER — APIXABAN 5 MG PO TABS: 5.0000 mg | ORAL_TABLET | Freq: Two times a day (BID) | ORAL | 3 refills | Status: DC

## 2022-04-10 NOTE — Telephone Encounter (Signed)
Patient dropped [place] patient assistance form for Eliquis and requested Eliquis 5 mg samples. Application and new Rx printed for provider for signature  Samples Eliquis 5 mg # 28 tablets given to patient

## 2022-04-12 ENCOUNTER — Telehealth: Payer: Self-pay | Admitting: Cardiology

## 2022-04-12 MED ORDER — APIXABAN 5 MG PO TABS: 5.0000 mg | ORAL_TABLET | Freq: Two times a day (BID) | ORAL | 1 refills | Status: DC

## 2022-04-12 NOTE — Telephone Encounter (Signed)
*  STAT* If patient is at the pharmacy, call can be transferred to refill team.   1. Which medications need to be refilled? (please list name of each medication and dose if known) apixaban (ELIQUIS) 5 MG TABS tablet   2. Which pharmacy/location (including street and city if local pharmacy) is medication to be sent to? [place], [place]   3. Do they need a 30 day or 90 day supply? 90 day

## 2022-04-12 NOTE — Addendum Note (Signed)
Addended by: Brynda Peon on: 04/12/2022 10:08 AM   Modules accepted: Orders

## 2022-04-12 NOTE — Telephone Encounter (Signed)
Pt last saw Dr Bettina Gavia 01/03/22, last labs 01/18/22 Creat 0.92, age 72, weight 94.3kg, based on specified criteria pt is on appropriate dosage of Eliquis 5mg  BID for afib.  Will refill rx.

## 2022-04-17 ENCOUNTER — Telehealth: Payer: Self-pay | Admitting: Cardiology

## 2022-04-17 DIAGNOSIS — Z79899 Other long term (current) drug therapy: Secondary | ICD-10-CM

## 2022-04-17 NOTE — Addendum Note (Signed)
Addended by: Jacqulynn Cadet on: 04/17/2022 05:18 PM   Modules accepted: Orders

## 2022-04-17 NOTE — Telephone Encounter (Signed)
Pt c/o medication issue:  1. Name of Medication: apixaban (ELIQUIS) 5 MG TABS tablet   2. How are you currently taking this medication (dosage and times per day)? Take 1 tablet (5 mg total) by mouth 2 (two) times daily.   3. Are you having a reaction (difficulty breathing--STAT)? No  4. What is your medication issue? Pt calling in regards to clearance done on 03/23/2022 for upcoming knee replacement and needing to know when he needs to stop taking above medication and when to start again. Surgery date is 05/08/2022. Pt would like a callback regarding this matter.

## 2022-04-17 NOTE — Telephone Encounter (Signed)
Patient will need to complete a CBC in order to have guidance completed for holding his Eliquis.  Patient's last CBC was completed 2 years ago.  Please order a CBC and have patient complete this week.  We will submit to our clinical pharmacist for guidance once blood work is completed.  Ambrose Pancoast, NP

## 2022-04-17 NOTE — Telephone Encounter (Signed)
Called the patient and informed him that he would need to have blood work completed this week. Order placed for CBC. Patient verbalized understanding. Patient asked if he needed to hold off on his deep cleaning dental appointment that is scheduled for 04/26/22

## 2022-04-18 ENCOUNTER — Other Ambulatory Visit: Payer: Self-pay

## 2022-04-18 DIAGNOSIS — Z79899 Other long term (current) drug therapy: Secondary | ICD-10-CM

## 2022-04-18 NOTE — Telephone Encounter (Signed)
   Patient Name: Faiyaz Bloom  DOB: 02-13-1950 MRN: [id number]  Primary Cardiologist: Shirlee More, MD  Chart reviewed as part of pre-operative protocol coverage.   Please advise patient that our office has not received any clearance recommendations for a dental procedure.  Regarding his holding Eliquis I would recommend that he reach out to his dentist for further advisement.  Mable Fill, Marissa Nestle, NP 04/18/2022, 7:40 AM

## 2022-04-18 NOTE — Telephone Encounter (Signed)
Lvm for pt to contact dental office to fax formal request to office.

## 2022-04-19 LAB — CBC
Hematocrit: 35.5 % — ABNORMAL LOW (ref 37.5–51.0)
Hemoglobin: 11.7 g/dL — ABNORMAL LOW (ref 13.0–17.7)
MCH: 31 pg (ref 26.6–33.0)
MCHC: 33 g/dL (ref 31.5–35.7)
MCV: 94 fL (ref 79–97)
Platelets: 155 10*3/uL (ref 150–450)
RBC: 3.78 x10E6/uL — ABNORMAL LOW (ref 4.14–5.80)
RDW: 12.4 % (ref 11.6–15.4)
WBC: 3.6 10*3/uL (ref 3.4–10.8)

## 2022-04-19 NOTE — Telephone Encounter (Signed)
Called patient with results for CBC ordered on 04/17/22. Benjamin Griffin stated that he cancelled the dental procedure because he did not want to mess up anything involving upcoming knee surgery. He thanked me for calling and verbalized understanding. All (if any) questions were answered.

## 2022-04-19 NOTE — Telephone Encounter (Signed)
Preop team please contact requesting provider's office and obtain guidance on holding his Eliquis for scheduled orthopedic procedure.

## 2022-04-19 NOTE — Telephone Encounter (Signed)
I s/w the pt today and he tells me that he has cancelled his dental procedure as he did not want to mess anything upi for his upcoming knee surgery 05/08/22 with [name]. Pt thanked me for the call and the help. I will update the pre op APP that we will need to clear for DDS at this time as this has been cancelled. Only clearance on hand is the original for knee surgery.

## 2022-04-19 NOTE — Telephone Encounter (Signed)
Lab results are in as needed for the original clearance request, see notes below. I will try to reach out to the pt again to let him know that we need a request from the DDS before we can clear for that procedure. I will update the pre op APP that lab results are in.

## 2022-04-19 NOTE — Telephone Encounter (Addendum)
Left message for [name] surgery scheduler to please call the office and confirm if they are needing to hold Eliquis for upcoming knee surgery and if [name] has a preference of hold time. Please call [phone], ok to leave vm.

## 2022-04-19 NOTE — Telephone Encounter (Signed)
I called surgeon office to see if they had found if needing yo hold Eliquis. Surgery scheduler stated she will had MD in the office in this afternoon and she will ask them him then and will call back. I thanked her for her help.

## 2022-04-20 NOTE — Telephone Encounter (Signed)
Per [name] office need to hold Eliquis for upcoming surgery.

## 2022-04-21 ENCOUNTER — Telehealth: Payer: Self-pay | Admitting: Cardiology

## 2022-04-21 NOTE — Telephone Encounter (Signed)
Patient with diagnosis of afib on Eliquis for anticoagulation.    Procedure: Major Orthopaedic Procedure  Date of procedure: TBD   CHA2DS2-VASc Score = 6   This indicates a 9.7% annual risk of stroke. The patient's score is based upon: CHF History: 1 HTN History: 1 Diabetes History: 0 Stroke History: 2 Vascular Disease History: 1 Age Score: 1 Gender Score: 0     CrCl 89 mL/min (SrCr. 0.92 01/18/2022) Platelet count 155 K   Typically hold Eliquis for 3 days prior to Major Orthopaedic Procedure,however, patient is at elevated risk off of anticoagulation based on history of afib and hx of stroke. Will forward to MD to confirm 3 day hold is acceptable   **This guidance is not considered finalized until pre-operative APP has relayed final recommendations.**

## 2022-04-21 NOTE — Telephone Encounter (Signed)
Pt c/o medication issue:  1. Name of Medication: apixaban (ELIQUIS) 5 MG TABS tablet   2. How are you currently taking this medication (dosage and times per day)? As prescribed   3. Are you having a reaction (difficulty breathing--STAT)?   4. What is your medication issue?  Patient would like a call back to go over being shy of $400 out of pocket to get this medication and would like a call back to discuss options. Please advise.

## 2022-04-21 NOTE — Telephone Encounter (Signed)
Called patient and informed him that we had patient assistance forms that we could complete and he stated that he had already done that and he was $400 dollars shy on being able to get patient assistance for Eliquis. He stated that at this point he was ok paying out of pocket for his Benjamin Griffin until he spent the $400 dollars, which then he would qualify for patient assistance. Patient had no further questions at this time.

## 2022-04-21 NOTE — Telephone Encounter (Addendum)
Dr. Sherlean Foot office called back today and stated that Dr. Sherlean Foot would like to hold Eliquis x 3 day. I said I will let pre op APP know of call today as well.   Also I was told that his procedure is Monday 04/24/22

## 2022-04-24 ENCOUNTER — Telehealth: Payer: Self-pay | Admitting: *Deleted

## 2022-04-24 NOTE — Telephone Encounter (Signed)
Pt has been scheduled for tele pre op appt 04/27/22 @ 10:20. Med rec and consent are done.     Patient Consent for Virtual Visit        Benjamin Griffin has provided verbal consent on 04/24/2022 for a virtual visit (video or telephone).   CONSENT FOR VIRTUAL VISIT FOR:  Benjamin Griffin  By participating in this virtual visit I agree to the following:  I hereby voluntarily request, consent and authorize [facility] and its employed or contracted physicians, physician assistants, nurse practitioners or other licensed health care professionals (the Practitioner), to provide me with telemedicine health care services (the "Services") as deemed necessary by the treating Practitioner. I acknowledge and consent to receive the Services by the Practitioner via telemedicine. I understand that the telemedicine visit will involve communicating with the Practitioner through live audiovisual communication technology and the disclosure of certain medical information by electronic transmission. I acknowledge that I have been given the opportunity to request an in-person assessment or other available alternative prior to the telemedicine visit and am voluntarily participating in the telemedicine visit.  I understand that I have the right to withhold or withdraw my consent to the use of telemedicine in the course of my care at any time, without affecting my right to future care or treatment, and that the Practitioner or I may terminate the telemedicine visit at any time. I understand that I have the right to inspect all information obtained and/or recorded in the course of the telemedicine visit and may receive copies of available information for a reasonable fee.  I understand that some of the potential risks of receiving the Services via telemedicine include:  Delay or interruption in medical evaluation due to technological equipment failure or disruption; Information transmitted may not be sufficient (e.g.  poor resolution of images) to allow for appropriate medical decision making by the Practitioner; and/or  In rare instances, security protocols could fail, causing a breach of personal health information.  Furthermore, I acknowledge that it is my responsibility to provide information about my medical history, conditions and care that is complete and accurate to the best of my ability. I acknowledge that Practitioner's advice, recommendations, and/or decision may be based on factors not within their control, such as incomplete or inaccurate data provided by me or distortions of diagnostic images or specimens that may result from electronic transmissions. I understand that the practice of medicine is not an exact science and that Practitioner makes no warranties or guarantees regarding treatment outcomes. I acknowledge that a copy of this consent can be made available to me via my patient portal [facility] MyChart), or I can request a printed copy by calling the office of [facility].    I understand that my insurance will be billed for this visit.   I have read or had this consent read to me. I understand the contents of this consent, which adequately explains the benefits and risks of the Services being provided via telemedicine.  I have been provided ample opportunity to ask questions regarding this consent and the Services and have had my questions answered to my satisfaction. I give my informed consent for the services to be provided through the use of telemedicine in my medical care

## 2022-04-24 NOTE — Telephone Encounter (Signed)
Pt has been scheduled for tele pre op appt 04/27/22 @ 10:20. Med rec and consent are done.

## 2022-04-24 NOTE — Telephone Encounter (Signed)
   Name: Benjamin Griffin  DOB: Feb 01, 1950  MRN: [id number]  Primary Cardiologist: Norman Herrlich, MD   Preoperative team, please contact this patient and set up a phone call appointment for further preoperative risk assessment. Please obtain consent and complete medication review. Thank you for your help.  I confirm that guidance regarding antiplatelet and oral anticoagulation therapy has been completed and, if necessary, noted below.   His Eliquis may be a for 3 days prior to his surgery.  Please resume as soon as hemostasis is achieved.  [name], NP 04/24/2022, 9:32 AM [facility]

## 2022-04-26 NOTE — Progress Notes (Unsigned)
Virtual Visit via Telephone Note   Because of Benjamin Griffin's co-morbid illnesses, he is at least at moderate risk for complications without adequate follow up.  This format is felt to be most appropriate for this patient at this time.  The patient did not have access to video technology/had technical difficulties with video requiring transitioning to audio format only (telephone).  All issues noted in this document were discussed and addressed.  No physical exam could be performed with this format.  Please refer to the patient's chart for his consent to telehealth for [facility].  Evaluation Performed:  Preoperative cardiovascular risk assessment _____________   Date:  04/26/2022   Patient ID:  Benjamin Griffin, DOB 12-24-1950, MRN [id number] Patient Location:  Home Provider location:   Office  Primary Care Provider:  Marylen Ponto, MD Primary Cardiologist:  Norman Herrlich, MD  Chief Complaint / Patient Profile   72 y.o. y/o male with a h/o PAF, nonobstructive CAD, hypertensive heart disease with heart failure, hypertension, hyperlipidemia, stroke who is pending right total knee arthroplasty by Dr. Valentina Gu and presents today for telephonic preoperative cardiovascular risk assessment.  History of Present Illness    Benjamin Griffin is a 72 y.o. male who presents via audio/video conferencing for a telehealth visit today.  Pt was last seen in cardiology clinic on 01/03/2022 by Dr. Dulce Sellar.  At that time Orbra Arnott had poorly controlled hypertension.  Medication changes were made on 03/17/2022 Dr. Dulce Sellar reviewed home BP log which showed improved BP control and he was cleared for surgery.  The patient is now pending procedure as outlined above. Since his last visit, he continues to do well. Patient denies shortness of breath or dyspnea on exertion. No chest pain, pressure, or tightness. Denies lower extremity edema, orthopnea, or PND. No palpitations. He is very active at home performing  yard work on 3 acres of land including cutting down trees. He also helps his neighbor with yard work. He continues to lose weight and is down near 20 lb since December.   Past Medical History    Past Medical History:  Diagnosis Date   Arthritis    Cancer (HCC)    skin cancer, removed from bilateral arms   History of kidney stones    Hyperlipidemia    Hypertension    Palpitations    Sleep apnea    CPAP   Stroke (HCC)    approx 2000, on plavix. When found on CT, was told it was an "old stroke"   Past Surgical History:  Procedure Laterality Date   APPENDECTOMY     CARDIAC CATHETERIZATION     approx year 2000 @ [facility] after stroke   CATARACT EXTRACTION, BILATERAL     EYE SURGERY     HERNIA REPAIR     LEFT HEART CATH AND CORONARY ANGIOGRAPHY N/A 10/04/2020   Procedure: LEFT HEART CATH AND CORONARY ANGIOGRAPHY;  Surgeon: Orbie Pyo, MD;  Location: [facility] INVASIVE [facility];  Service: Cardiovascular;  Laterality: N/A;   LOOP RECORDER INSERTION     approx 2000    LOOP RECORDER REMOVAL     1 week after instertion, approx 2000   NASAL SINUS SURGERY     TRANSFORAMINAL LUMBAR INTERBODY FUSION W/ MIS 1 LEVEL N/A 01/01/2020   Procedure: TRANSFORAMINAL LUMBAR INTERBODY FUSION (TLIF) L4-L5;  Surgeon: Bedelia Person, MD;  Location: [facility] OR;  Service: Neurosurgery;  Laterality: N/A;  TRANSFORAMINAL LUMBAR INTERBODY FUSION (TLIF) L4-L5    UMBILICAL HERNIA REPAIR  Allergies  Allergies  Allergen Reactions   Meperidine Other (See Comments)    Sweating    Vicodin [Hydrocodone-Acetaminophen] Nausea And Vomiting    Home Medications    Prior to Admission medications   Medication Sig Start Date End Date Taking? Authorizing Provider  amLODipine (NORVASC) 5 MG tablet Take 1 tablet (5 mg total) by mouth daily. 01/03/22 12/29/22  Baldo Daub, MD  apixaban (ELIQUIS) 5 MG TABS tablet Take 1 tablet (5 mg total) by mouth 2 (two) times daily. 04/12/22   Baldo Daub, MD  carvedilol  (COREG) 6.25 MG tablet Take 1 tablet (6.25 mg total) by mouth 2 (two) times daily. 01/03/22   Baldo Daub, MD  fenofibrate micronized (LOFIBRA) 134 MG capsule Take 134 mg by mouth daily. 09/09/21   [provider]  flecainide (TAMBOCOR) 50 MG tablet Take 1 tablet (50 mg total) by mouth 2 (two) times daily. 01/03/22   Baldo Daub, MD  simvastatin (ZOCOR) 20 MG tablet Take 20 mg by mouth daily.    [provider]  valsartan-hydrochlorothiazide (DIOVAN HCT) 160-12.5 MG tablet Take 1 tablet by mouth daily. 02/20/22   Baldo Daub, MD    Physical Exam    Vital Signs:  Camille Shillito does not have vital signs available for review today.  Given telephonic nature of communication, physical exam is limited. AAOx3. NAD. Normal affect.  Speech and respirations are unlabored.  Accessory Clinical Findings    None  Assessment & Plan    Primary Cardiologist: Norman Herrlich, MD  Preoperative cardiovascular risk assessment.  Right total knee arthroplasty by Dr. Valentina Gu on 05/08/2022.  Chart reviewed as part of pre-operative protocol coverage. According to the RCRI, patient has a 6.6% risk of MACE. Patient reports activity equivalent to >4.0 METS (yard work on 3 acres of land, cutting down trees, helping neighbor with yard work as well).   Given past medical history and time since last visit, based on ACC/AHA guidelines, Penny Mccadden would be at acceptable risk for the planned procedure without further cardiovascular testing.   Patient was advised that if he develops new symptoms prior to surgery to contact our office to arrange a follow-up appointment.  he verbalized understanding.  2. Anti-coag.  Per Pharm.D. and Dr. Dulce Sellar: Patient with diagnosis of afib on Eliquis for anticoagulation.     Procedure: Major Orthopaedic Procedure  Date of procedure: TBD     CHA2DS2-VASc Score = 6   This indicates a 9.7% annual risk of stroke. The patient's score is based upon: CHF  History: 1 HTN History: 1 Diabetes History: 0 Stroke History: 2 Vascular Disease History: 1 Age Score: 1 Gender Score: 0    CrCl 89 mL/min (SrCr. 0.92 01/18/2022) Platelet count 155 K   Typically hold Eliquis for 3 days prior to Major Orthopaedic Procedure,however, patient is at elevated risk off of anticoagulation based on history of afib and hx of stroke. Will forward to MD to confirm 3 day hold is acceptable.   I will route this recommendation to the requesting party via Epic fax function.  Please call with questions.  Time:   Today, I have spent 6 minutes with the patient with telehealth technology discussing medical history, symptoms, and management plan.     Carlos Levering, NP  04/26/2022, 8:06 PM

## 2022-04-27 ENCOUNTER — Ambulatory Visit: Payer: PPO | Attending: Cardiology | Admitting: Student

## 2022-04-27 DIAGNOSIS — Z01810 Encounter for preprocedural cardiovascular examination: Secondary | ICD-10-CM

## 2022-04-28 DIAGNOSIS — G8929 Other chronic pain: Secondary | ICD-10-CM | POA: Diagnosis not present

## 2022-04-28 DIAGNOSIS — M25561 Pain in right knee: Secondary | ICD-10-CM | POA: Diagnosis not present

## 2022-05-03 ENCOUNTER — Telehealth: Payer: Self-pay

## 2022-05-03 NOTE — Telephone Encounter (Signed)
Notice from [facility] patient assistance foundation stating patient is not eligible to receive Eliquis due to documentation of 3% out-of-pocket expenses not being met.   Copy of letter in chart media and per company a copy has been sent to patient as well.

## 2022-05-04 DIAGNOSIS — G4733 Obstructive sleep apnea (adult) (pediatric): Secondary | ICD-10-CM | POA: Diagnosis not present

## 2022-05-04 DIAGNOSIS — Z8673 Personal history of transient ischemic attack (TIA), and cerebral infarction without residual deficits: Secondary | ICD-10-CM | POA: Diagnosis not present

## 2022-05-04 DIAGNOSIS — I10 Essential (primary) hypertension: Secondary | ICD-10-CM | POA: Diagnosis not present

## 2022-05-07 DIAGNOSIS — G4733 Obstructive sleep apnea (adult) (pediatric): Secondary | ICD-10-CM | POA: Diagnosis not present

## 2022-05-08 DIAGNOSIS — G8918 Other acute postprocedural pain: Secondary | ICD-10-CM | POA: Diagnosis not present

## 2022-05-08 DIAGNOSIS — Z96651 Presence of right artificial knee joint: Secondary | ICD-10-CM | POA: Diagnosis not present

## 2022-05-08 DIAGNOSIS — M1711 Unilateral primary osteoarthritis, right knee: Secondary | ICD-10-CM | POA: Diagnosis not present

## 2022-05-10 DIAGNOSIS — Z96651 Presence of right artificial knee joint: Secondary | ICD-10-CM | POA: Diagnosis not present

## 2022-05-18 DIAGNOSIS — Z471 Aftercare following joint replacement surgery: Secondary | ICD-10-CM | POA: Diagnosis not present

## 2022-05-18 DIAGNOSIS — Z96651 Presence of right artificial knee joint: Secondary | ICD-10-CM | POA: Diagnosis not present

## 2022-05-19 DIAGNOSIS — Z96651 Presence of right artificial knee joint: Secondary | ICD-10-CM | POA: Diagnosis not present

## 2022-05-19 DIAGNOSIS — M1711 Unilateral primary osteoarthritis, right knee: Secondary | ICD-10-CM | POA: Diagnosis not present

## 2022-05-19 DIAGNOSIS — M25561 Pain in right knee: Secondary | ICD-10-CM | POA: Diagnosis not present

## 2022-05-23 DIAGNOSIS — Z96651 Presence of right artificial knee joint: Secondary | ICD-10-CM | POA: Diagnosis not present

## 2022-05-23 DIAGNOSIS — M25561 Pain in right knee: Secondary | ICD-10-CM | POA: Diagnosis not present

## 2022-05-23 DIAGNOSIS — M1711 Unilateral primary osteoarthritis, right knee: Secondary | ICD-10-CM | POA: Diagnosis not present

## 2022-05-26 DIAGNOSIS — M25561 Pain in right knee: Secondary | ICD-10-CM | POA: Diagnosis not present

## 2022-05-26 DIAGNOSIS — M1711 Unilateral primary osteoarthritis, right knee: Secondary | ICD-10-CM | POA: Diagnosis not present

## 2022-05-26 DIAGNOSIS — Z96651 Presence of right artificial knee joint: Secondary | ICD-10-CM | POA: Diagnosis not present

## 2022-05-30 DIAGNOSIS — M25561 Pain in right knee: Secondary | ICD-10-CM | POA: Diagnosis not present

## 2022-05-30 DIAGNOSIS — Z96651 Presence of right artificial knee joint: Secondary | ICD-10-CM | POA: Diagnosis not present

## 2022-05-30 DIAGNOSIS — M1711 Unilateral primary osteoarthritis, right knee: Secondary | ICD-10-CM | POA: Diagnosis not present

## 2022-06-02 DIAGNOSIS — M1711 Unilateral primary osteoarthritis, right knee: Secondary | ICD-10-CM | POA: Diagnosis not present

## 2022-06-02 DIAGNOSIS — Z96651 Presence of right artificial knee joint: Secondary | ICD-10-CM | POA: Diagnosis not present

## 2022-06-02 DIAGNOSIS — M25561 Pain in right knee: Secondary | ICD-10-CM | POA: Diagnosis not present

## 2022-06-06 ENCOUNTER — Telehealth: Payer: Self-pay | Admitting: *Deleted

## 2022-06-06 ENCOUNTER — Telehealth: Payer: Self-pay

## 2022-06-06 DIAGNOSIS — Z96651 Presence of right artificial knee joint: Secondary | ICD-10-CM | POA: Diagnosis not present

## 2022-06-06 DIAGNOSIS — K4090 Unilateral inguinal hernia, without obstruction or gangrene, not specified as recurrent: Secondary | ICD-10-CM | POA: Diagnosis not present

## 2022-06-06 DIAGNOSIS — M1711 Unilateral primary osteoarthritis, right knee: Secondary | ICD-10-CM | POA: Diagnosis not present

## 2022-06-06 DIAGNOSIS — M25561 Pain in right knee: Secondary | ICD-10-CM | POA: Diagnosis not present

## 2022-06-06 DIAGNOSIS — G4733 Obstructive sleep apnea (adult) (pediatric): Secondary | ICD-10-CM | POA: Diagnosis not present

## 2022-06-06 HISTORY — DX: Unilateral inguinal hernia, without obstruction or gangrene, not specified as recurrent: K40.90

## 2022-06-06 NOTE — Telephone Encounter (Signed)
Spoke with patient states his procedure is on 5/28 so he is agreeable to do a tele visit on 5/24 at 3 pm. Med rec and consent have been done.

## 2022-06-06 NOTE — Telephone Encounter (Signed)
Patient with diagnosis of atrial fibrillation on Eliquis for anticoagulation.    Procedure: right inguinal hernia repair Date of procedure: TBD   CHA2DS2-VASc Score = 6   This indicates a 9.7% annual risk of stroke. The patient's score is based upon: CHF History: 1 HTN History: 1 Diabetes History: 0 Stroke History: 2 Vascular Disease History: 1 Age Score: 1 Gender Score: 0   Chart notes that stroke was found on CT in 2000 and noted as "old stroke" at that time  CrCl 100 Platelet count 155  Per office protocol, patient can hold Eliquis for 2 days prior to procedure.   Patient will not need bridging with Lovenox (enoxaparin) around procedure.  **This guidance is not considered finalized until pre-operative APP has relayed final recommendations.**

## 2022-06-06 NOTE — Telephone Encounter (Signed)
  Patient Consent for Virtual Visit        Benjamin Griffin has provided verbal consent on 06/06/2022 for a virtual visit (video or telephone).   CONSENT FOR VIRTUAL VISIT FOR:  Benjamin Griffin  By participating in this virtual visit I agree to the following:  I hereby voluntarily request, consent and authorize [facility] and its employed or contracted physicians, physician assistants, nurse practitioners or other licensed health care professionals (the Practitioner), to provide me with telemedicine health care services (the "Services") as deemed necessary by the treating Practitioner. I acknowledge and consent to receive the Services by the Practitioner via telemedicine. I understand that the telemedicine visit will involve communicating with the Practitioner through live audiovisual communication technology and the disclosure of certain medical information by electronic transmission. I acknowledge that I have been given the opportunity to request an in-person assessment or other available alternative prior to the telemedicine visit and am voluntarily participating in the telemedicine visit.  I understand that I have the right to withhold or withdraw my consent to the use of telemedicine in the course of my care at any time, without affecting my right to future care or treatment, and that the Practitioner or I may terminate the telemedicine visit at any time. I understand that I have the right to inspect all information obtained and/or recorded in the course of the telemedicine visit and may receive copies of available information for a reasonable fee.  I understand that some of the potential risks of receiving the Services via telemedicine include:  Delay or interruption in medical evaluation due to technological equipment failure or disruption; Information transmitted may not be sufficient (e.g. poor resolution of images) to allow for appropriate medical decision making by the Practitioner;  and/or  In rare instances, security protocols could fail, causing a breach of personal health information.  Furthermore, I acknowledge that it is my responsibility to provide information about my medical history, conditions and care that is complete and accurate to the best of my ability. I acknowledge that Practitioner's advice, recommendations, and/or decision may be based on factors not within their control, such as incomplete or inaccurate data provided by me or distortions of diagnostic images or specimens that may result from electronic transmissions. I understand that the practice of medicine is not an exact science and that Practitioner makes no warranties or guarantees regarding treatment outcomes. I acknowledge that a copy of this consent can be made available to me via my patient portal [facility] MyChart), or I can request a printed copy by calling the office of [facility].    I understand that my insurance will be billed for this visit.   I have read or had this consent read to me. I understand the contents of this consent, which adequately explains the benefits and risks of the Services being provided via telemedicine.  I have been provided ample opportunity to ask questions regarding this consent and the Services and have had my questions answered to my satisfaction. I give my informed consent for the services to be provided through the use of telemedicine in my medical care

## 2022-06-06 NOTE — Telephone Encounter (Signed)
   Pre-operative Risk Assessment    Patient Name: Benjamin Griffin  DOB: 1950/10/18 MRN: [id number]      Request for Surgical Clearance    Procedure:  Right inguinal hernia repair.  Date of Surgery:  Clearance TBD                                 Surgeon:  Dr. Georgiana Shore Surgeon's Group or Practice Name:  [facility] [place] Phone number:  [phone] Fax number:  [phone]   Type of Clearance Requested:   - Medical  - Pharmacy:  Hold Apixaban (Eliquis) Not Indicated.    Type of Anesthesia:  General    Additional requests/questions:    Signed, Emmit Pomfret   06/06/2022, 11:17 AM

## 2022-06-06 NOTE — Telephone Encounter (Signed)
   Name: Benjamin Griffin  DOB: [birth year]  MRN: [id number]  Primary Cardiologist: Norman Herrlich, MD   Preoperative team, please contact this patient and set up a phone call appointment for further preoperative risk assessment. Please obtain consent and complete medication review. Thank you for your help.  I confirm that guidance regarding antiplatelet and oral anticoagulation therapy has been completed and, if necessary, noted below.  Per office protocol, patient can hold Eliquis for 2 days prior to procedure.   Patient will not need bridging with Lovenox (enoxaparin) around procedure.   Joylene Grapes, NP 06/06/2022, 4:08 PM [facility]

## 2022-06-09 ENCOUNTER — Ambulatory Visit: Payer: PPO | Attending: Cardiovascular Disease

## 2022-06-09 DIAGNOSIS — Z96651 Presence of right artificial knee joint: Secondary | ICD-10-CM | POA: Diagnosis not present

## 2022-06-09 DIAGNOSIS — Z01810 Encounter for preprocedural cardiovascular examination: Secondary | ICD-10-CM

## 2022-06-09 DIAGNOSIS — M25561 Pain in right knee: Secondary | ICD-10-CM | POA: Diagnosis not present

## 2022-06-09 DIAGNOSIS — M1711 Unilateral primary osteoarthritis, right knee: Secondary | ICD-10-CM | POA: Diagnosis not present

## 2022-06-09 NOTE — Progress Notes (Signed)
Virtual Visit via Telephone Note   Because of Benjamin Griffin's co-morbid illnesses, he is at least at moderate risk for complications without adequate follow up.  This format is felt to be most appropriate for this patient at this time.  The patient did not have access to video technology/had technical difficulties with video requiring transitioning to audio format only (telephone).  All issues noted in this document were discussed and addressed.  No physical exam could be performed with this format.  Please refer to the patient's chart for his consent to telehealth for [facility].  Evaluation Performed:  Preoperative cardiovascular risk assessment _____________   Date:  06/09/2022   Patient ID:  Benjamin Griffin, DOB Dec 10, 1950, MRN [id number] Patient Location:  Home Provider location:   Office  Primary Care Provider:  Marylen Ponto, MD Primary Cardiologist:  Norman Herrlich, MD  Chief Complaint / Patient Profile   72 y.o. y/o male with a h/o PAF, HTN, HLD, CAD s/p NSTEMI, CVA s/p 2000, sleep apnea who is pending right inguinal hernia repair and presents today for telephonic preoperative cardiovascular risk assessment.  History of Present Illness    Benjamin Griffin is a 72 y.o. male who presents via audio/video conferencing for a telehealth visit today.  Pt was last seen in cardiology clinic on 01/03/2022 by Dr. Dulce Sellar. At that time Trinidad Wicker was doing well and maintaining sinus rhythm however blood pressures were poorly controlled.The patient is now pending procedure as outlined above. Since his last visit, he has been doing well with no new cardiac complaints.  He is staying active and is able to complete all ADLs without any difficulty.  He has much better control of his blood pressures and they have been ranging from from  115-130 over 60s  He denies chest pain, shortness of breath, lower extremity edema, fatigue, palpitations, melena, hematuria, hemoptysis, diaphoresis,  weakness, presyncope, syncope, orthopnea, and PND.  Past Medical History    Past Medical History:  Diagnosis Date   Arthritis    Cancer (HCC)    skin cancer, removed from bilateral arms   History of kidney stones    Hyperlipidemia    Hypertension    Palpitations    Sleep apnea    CPAP   Stroke (HCC)    approx 2000, on plavix. When found on CT, was told it was an "old stroke"   Past Surgical History:  Procedure Laterality Date   APPENDECTOMY     CARDIAC CATHETERIZATION     approx year 2000 @ [place] after stroke   CATARACT EXTRACTION, BILATERAL     EYE SURGERY     HERNIA REPAIR     LEFT HEART CATH AND CORONARY ANGIOGRAPHY N/A 10/04/2020   Procedure: LEFT HEART CATH AND CORONARY ANGIOGRAPHY;  Surgeon: Orbie Pyo, MD;  Location: [facility];  Service: Cardiovascular;  Laterality: N/A;   LOOP RECORDER INSERTION     approx 2000    LOOP RECORDER REMOVAL     1 week after instertion, approx 2000   NASAL SINUS SURGERY     TRANSFORAMINAL LUMBAR INTERBODY FUSION W/ MIS 1 LEVEL N/A 01/01/2020   Procedure: TRANSFORAMINAL LUMBAR INTERBODY FUSION (TLIF) L4-L5;  Surgeon: Bedelia Person, MD;  Location: [facility] OR;  Service: Neurosurgery;  Laterality: N/A;  TRANSFORAMINAL LUMBAR INTERBODY FUSION (TLIF) L4-L5    UMBILICAL HERNIA REPAIR      Allergies  Allergies  Allergen Reactions   Meperidine Other (See Comments)    Sweating  Vicodin [Hydrocodone-Acetaminophen] Nausea And Vomiting    Home Medications    Prior to Admission medications   Medication Sig Start Date End Date Taking? Authorizing Provider  amLODipine (NORVASC) 5 MG tablet Take 1 tablet (5 mg total) by mouth daily. 01/03/22 12/29/22  Baldo Daub, MD  apixaban (ELIQUIS) 5 MG TABS tablet Take 1 tablet (5 mg total) by mouth 2 (two) times daily. 04/12/22   Baldo Daub, MD  carvedilol (COREG) 6.25 MG tablet Take 1 tablet (6.25 mg total) by mouth 2 (two) times daily. 01/03/22   Baldo Daub, MD   fenofibrate micronized (LOFIBRA) 134 MG capsule Take 134 mg by mouth daily. 09/09/21   [provider]  flecainide (TAMBOCOR) 50 MG tablet Take 1 tablet (50 mg total) by mouth 2 (two) times daily. 01/03/22   Baldo Daub, MD  simvastatin (ZOCOR) 20 MG tablet Take 20 mg by mouth daily.    [provider]  valsartan-hydrochlorothiazide (DIOVAN HCT) 160-12.5 MG tablet Take 1 tablet by mouth daily. 02/20/22   Baldo Daub, MD    Physical Exam    Vital Signs:  Jamane Drayton does not have vital signs available for review today.  126/72 pulse over 70  Given telephonic nature of communication, physical exam is limited. AAOx3. NAD. Normal affect.  Speech and respirations are unlabored.  Accessory Clinical Findings    None  Assessment & Plan    1.  Preoperative Cardiovascular Risk Assessment:  Patient's RCRI score is 11%  The patient affirms he has been doing well without any new cardiac symptoms. They are able to achieve 6 METS without cardiac limitations. Therefore, based on ACC/AHA guidelines, the patient would be at acceptable risk for the planned procedure without further cardiovascular testing. The patient was advised that if he develops new symptoms prior to surgery to contact our office to arrange for a follow-up visit, and he verbalized understanding.   The patient was advised that if he develops new symptoms prior to surgery to contact our office to arrange for a follow-up visit, and he verbalized understanding.  Patient can hold Plavix 2 days prior to procedure and should restart postprocedure when surgically safe and as advised by his performing provider.  A copy of this note will be routed to requesting surgeon.  Time:   Today, I have spent 8 minutes with the patient with telehealth technology discussing medical history, symptoms, and management plan.     Napoleon Form, Leodis Rains, NP  06/09/2022, 7:26 AM

## 2022-06-13 DIAGNOSIS — M1711 Unilateral primary osteoarthritis, right knee: Secondary | ICD-10-CM | POA: Diagnosis not present

## 2022-06-13 DIAGNOSIS — M25561 Pain in right knee: Secondary | ICD-10-CM | POA: Diagnosis not present

## 2022-06-13 DIAGNOSIS — Z96651 Presence of right artificial knee joint: Secondary | ICD-10-CM | POA: Diagnosis not present

## 2022-06-14 DIAGNOSIS — Z8673 Personal history of transient ischemic attack (TIA), and cerebral infarction without residual deficits: Secondary | ICD-10-CM | POA: Diagnosis not present

## 2022-06-14 DIAGNOSIS — Z7901 Long term (current) use of anticoagulants: Secondary | ICD-10-CM | POA: Diagnosis not present

## 2022-06-14 DIAGNOSIS — Z79899 Other long term (current) drug therapy: Secondary | ICD-10-CM | POA: Diagnosis not present

## 2022-06-14 DIAGNOSIS — K4090 Unilateral inguinal hernia, without obstruction or gangrene, not specified as recurrent: Secondary | ICD-10-CM | POA: Diagnosis not present

## 2022-06-14 DIAGNOSIS — D176 Benign lipomatous neoplasm of spermatic cord: Secondary | ICD-10-CM | POA: Diagnosis not present

## 2022-06-14 DIAGNOSIS — I10 Essential (primary) hypertension: Secondary | ICD-10-CM | POA: Diagnosis not present

## 2022-06-26 DIAGNOSIS — Z09 Encounter for follow-up examination after completed treatment for conditions other than malignant neoplasm: Secondary | ICD-10-CM | POA: Diagnosis not present

## 2022-06-26 DIAGNOSIS — K4090 Unilateral inguinal hernia, without obstruction or gangrene, not specified as recurrent: Secondary | ICD-10-CM | POA: Diagnosis not present

## 2022-07-06 DIAGNOSIS — Z96651 Presence of right artificial knee joint: Secondary | ICD-10-CM | POA: Diagnosis not present

## 2022-07-07 DIAGNOSIS — G4733 Obstructive sleep apnea (adult) (pediatric): Secondary | ICD-10-CM | POA: Diagnosis not present

## 2022-07-09 NOTE — Progress Notes (Unsigned)
Cardiology Office Note:    Date:  07/10/2022   ID:  Benjamin Griffin, DOB 02/15/1950, MRN [id number]  PCP:  Marylen Ponto, MD  Cardiologist:  Norman Herrlich, MD    Referring MD: Marylen Ponto, MD    ASSESSMENT:    1. Paroxysmal atrial fibrillation (HCC)   2. High risk medication use   3. Chronic anticoagulation   4. Hypertensive heart disease with heart failure (HCC)   5. Mixed hyperlipidemia    PLAN:    In order of problems listed above:  Continues to do well with low-dose flecainide continue the same along with his anticoagulant BP is well-controlled [name] less than 120 systolic and request to stop taking a thiazide diuretic because of urinary frequency Continue his statin lipid profile in January LDL 54 cholesterol 106.   Next appointment: 6 months   Medication Adjustments/Labs and Tests Ordered: Current medicines are reviewed at length with the patient today.  Concerns regarding medicines are outlined above.  Orders Placed This Encounter  Procedures   EKG 12-Lead   No orders of the defined types were placed in this encounter.    History of Present Illness:    Benjamin Griffin is a 72 y.o. male with a hx of paroxysmal atrial fibrillation suppressed with flecainide therapy and reduced dose anticoagulant hypertensive heart disease with heart failure hyperlipidemia and coronary artery disease last seen 01/03/2022.  He presentedwith non-ST elevation MI in September 2022 left heart catheterization showed no obstructive CAD in the proximal right coronary artery and LAD with significant elevation in high-sensitivity troponin.  He was last seen 04/26/2021 and in retrospect his clinical event was most consistent with Takotsubo cardiomyopathy. EchoCardiogram performed 05/04/2021.  Left ventricle showed mild concentric LVH normal size normal systolic function echo EF 60-65 normal left ventricular GLS he was felt to have grade 2 diastolic dysfunction right ventricle normal size and  function without elevated pulmonary artery pressures left atrium moderately enlarged.  Compliance with diet, lifestyle and medications: Yes  He had recent total knee arthroplasty and is markedly improved.  He remains quite active and continues car restoration Has had no bleeding from his anticoagulant no muscle pain or weakness from his lipid-lowering therapy and no breakthrough episodes of rapid heart rhythm.  No chest pain edema shortness of breath Past Medical History:  Diagnosis Date   Arthritis    Cancer (HCC)    skin cancer, removed from bilateral arms   History of kidney stones    Hyperlipidemia    Hypertension    Palpitations    Sleep apnea    CPAP   Stroke (HCC)    approx 2000, on plavix. When found on CT, was told it was an "old stroke"    Past Surgical History:  Procedure Laterality Date   APPENDECTOMY     CARDIAC CATHETERIZATION     approx year 2000 @ [place] after stroke   CATARACT EXTRACTION, BILATERAL     EYE SURGERY     HERNIA REPAIR     LEFT HEART CATH AND CORONARY ANGIOGRAPHY N/A 10/04/2020   Procedure: LEFT HEART CATH AND CORONARY ANGIOGRAPHY;  Surgeon: Orbie Pyo, MD;  Location: [facility];  Service: Cardiovascular;  Laterality: N/A;   LOOP RECORDER INSERTION     approx 2000    LOOP RECORDER REMOVAL     1 week after instertion, approx 2000   NASAL SINUS SURGERY     TRANSFORAMINAL LUMBAR INTERBODY FUSION W/ MIS 1 LEVEL N/A 01/01/2020  Procedure: TRANSFORAMINAL LUMBAR INTERBODY FUSION (TLIF) L4-L5;  Surgeon: Bedelia Person, MD;  Location: [facility] OR;  Service: Neurosurgery;  Laterality: N/A;  TRANSFORAMINAL LUMBAR INTERBODY FUSION (TLIF) L4-L5    UMBILICAL HERNIA REPAIR      Current Medications: Current Meds  Medication Sig   amLODipine (NORVASC) 5 MG tablet Take 1 tablet (5 mg total) by mouth daily.   apixaban (ELIQUIS) 5 MG TABS tablet Take 1 tablet (5 mg total) by mouth 2 (two) times daily.   carvedilol (COREG) 6.25 MG tablet Take 1  tablet (6.25 mg total) by mouth 2 (two) times daily.   fenofibrate micronized (LOFIBRA) 134 MG capsule Take 134 mg by mouth daily.   flecainide (TAMBOCOR) 50 MG tablet Take 1 tablet (50 mg total) by mouth 2 (two) times daily.   simvastatin (ZOCOR) 20 MG tablet Take 20 mg by mouth daily.   valsartan-hydrochlorothiazide (DIOVAN HCT) 160-12.5 MG tablet Take 1 tablet by mouth daily.     Allergies:   Meperidine and Vicodin [hydrocodone-acetaminophen]     EKGs/Labs/Other Studies Reviewed:    The following studies were reviewed today:  Cardiac Studies & Procedures   CARDIAC CATHETERIZATION  CARDIAC CATHETERIZATION 10/04/2020  Narrative   Prox LAD lesion is 15% stenosed.   Prox RCA lesion is 20% stenosed.   LV end diastolic pressure is low.  Mild obstructive coronary artery disease involving the proximal LAD and RCA. Low LVEDP.  Recommendation:  Medical therapy.  Findings Coronary Findings Diagnostic  Dominance: Right  Left Anterior Descending Prox LAD lesion is 15% stenosed.  Right Coronary Artery Prox RCA lesion is 20% stenosed.  Intervention  No interventions have been documented.     ECHOCARDIOGRAM  ECHOCARDIOGRAM COMPLETE 05/04/2021  Narrative ECHOCARDIOGRAM REPORT    Patient Name:   Benjamin Griffin Date of Exam: 05/04/2021 Medical Rec #:  [id number]      Height:       72.0 in Accession #:    [id number]     Weight:       195.6 lb Date of Birth:  28-Sep-1950       BSA:          2.110 m Patient Age:    70 years       BP:           132/68 mmHg Patient Gender: M              HR:           60 bpm. 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 Previous Myocardial Infarction and CAD, Arrythmias:Atrial Fibrillation; Risk Factors:Hypertension and Dyslipidemia.  Sonographer:    Margreta Journey RDCS Referring Phys: [id number] Lenvil Swaim J Loyed Wilmes  IMPRESSIONS   1. Left ventricular ejection fraction, by estimation, is 60 to 65%. The left ventricle has normal function. The left ventricle has no regional wall motion abnormalities. There is mild concentric left ventricular hypertrophy. Left ventricular diastolic parameters are consistent with Grade I diastolic dysfunction (impaired relaxation). Elevated left ventricular end-diastolic pressure. The average left ventricular global  longitudinal strain is -20.1 %. The global longitudinal strain is normal. 2. Right ventricular systolic function is normal. The right ventricular size is normal. There is mildly elevated pulmonary artery systolic pressure. 3. Left atrial size was moderately dilated. 4. The mitral valve is normal in structure. No evidence of mitral valve regurgitation. No evidence of mitral stenosis. 5. The aortic valve is tricuspid. Aortic valve regurgitation is mild. Aortic valve sclerosis is present, with no evidence of aortic valve stenosis. 6. The inferior vena cava is normal in size with greater than 50% respiratory variability, suggesting right atrial pressure of 3 mmHg.  FINDINGS Left Ventricle: Left ventricular ejection fraction, by estimation, is 60 to 65%. The left ventricle has normal function. The left ventricle has no regional wall motion abnormalities. The average left ventricular global longitudinal strain is -20.1 %. The global longitudinal strain is normal. The left ventricular internal cavity size was normal in size.  There is mild concentric left ventricular hypertrophy. Left ventricular diastolic parameters are consistent with Grade I diastolic dysfunction (impaired relaxation). Elevated left ventricular end-diastolic pressure.  Right Ventricle: The right ventricular size is normal. No increase in right ventricular wall thickness. Right ventricular systolic function is normal. There is mildly elevated pulmonary artery systolic pressure. The tricuspid regurgitant velocity is 2.92 m/s, and with an assumed right atrial pressure of 8 mmHg, the estimated right ventricular systolic pressure is 42.1 mmHg.  Left Atrium: Left atrial size was moderately dilated.  Right Atrium: Right atrial size was normal in size.  Pericardium: There is no evidence of pericardial effusion.  Mitral Valve: The mitral valve is normal in structure. No evidence of mitral valve regurgitation. No evidence of mitral valve stenosis.  Tricuspid Valve: The tricuspid valve is normal in structure. Tricuspid valve regurgitation is mild . No evidence of tricuspid stenosis.  Aortic Valve: The aortic valve is tricuspid. Aortic valve regurgitation is mild. Aortic regurgitation PHT measures 546 msec. Aortic valve sclerosis is present, with no evidence of aortic valve stenosis.  Pulmonic Valve: The pulmonic valve was normal in structure. Pulmonic valve regurgitation is trivial. No evidence of pulmonic stenosis.  Aorta: The aortic root and ascending aorta are structurally normal, with no evidence of dilitation and the aortic arch was not well visualized.  Venous: A normal flow pattern is recorded from the right upper pulmonary vein. The inferior vena cava is normal in size with greater than 50% respiratory variability, suggesting right atrial pressure of 3 mmHg.  IAS/Shunts: No atrial level shunt detected by color flow Doppler.   LEFT VENTRICLE PLAX 2D LVIDd:         5.00 cm   Diastology LVIDs:         3.20 cm   LV e' medial:    6.64 cm/s LV  PW:         1.10 cm   LV E/e' medial:  10.3 LV IVS:        1.10 cm   LV e' lateral:   10.60 cm/s LVOT diam:     2.00 cm   LV E/e' lateral: 6.5 LV SV:         102 LV SV Index:   48        2D Longitudinal Strain LVOT Area:     3.14 cm  2D Strain GLS Avg:     -20.1 %  3D Volume EF: 3D EF:        68 % LV EDV:       202 ml LV ESV:  64 ml LV SV:        138 ml  RIGHT VENTRICLE             IVC RV Basal diam:  2.80 cm     IVC diam: 2.30 cm RV S prime:     14.50 cm/s TAPSE (M-mode): 3.1 cm  LEFT ATRIUM              Index        RIGHT ATRIUM           Index LA diam:        4.10 cm  1.94 cm/m   RA Area:     20.90 cm LA Vol (A2C):   107.0 ml 50.70 ml/m  RA Volume:   59.00 ml  27.96 ml/m LA Vol (A4C):   80.9 ml  38.34 ml/m LA Biplane Vol: 94.6 ml  44.83 ml/m AORTIC VALVE             PULMONIC VALVE LVOT Vmax:   150.00 cm/s PR End Diast Vel: 4.24 msec LVOT Vmean:  97.400 cm/s LVOT VTI:    0.325 m AI PHT:      546 msec  AORTA Ao Root diam: 3.90 cm Ao Asc diam:  3.50 cm Ao Desc diam: 2.40 cm  MITRAL VALVE               TRICUSPID VALVE MV Area (PHT): 4.21 cm    TR Peak grad:   34.1 mmHg MV Decel Time: 180 msec    TR Vmax:        292.00 cm/s MR Peak grad: 69.9 mmHg MR Mean grad: 49.0 mmHg    SHUNTS MR Vmax:      418.00 cm/s  Systemic VTI:  0.32 m MR Vmean:     340.0 cm/s   Systemic Diam: 2.00 cm MV E velocity: 68.60 cm/s MV A velocity: 57.80 cm/s MV E/A ratio:  1.19  Norman Herrlich MD Electronically signed by Norman Herrlich MD Signature Date/Time: 05/04/2021/10:24:27 AM    Final             EKG:  EKG Interpretation  Date/Time:  Monday July 10 2022 15:17:39 EDT Ventricular Rate:  66 PR Interval:  208 QRS Duration: 106 QT Interval:  420 QTC Calculation: 440 R Axis:   5 Text Interpretation: Normal sinus rhythm Normal ECG When compared with ECG of 05-Oct-2020 07:56, QRSD is mildly increased from previous 98 msec without !C toxicity No significant change was found  Confirmed by Norman Herrlich ([id number]) on 07/10/2022 3:23:37 PM    Recent Labs: 01/18/2022: BUN 16; Creatinine, Ser 0.92; Potassium 3.9; Sodium 138 04/18/2022: Hemoglobin 11.7; Platelets 155  Recent Lipid Panel    Component Value Date/Time   CHOL 106 01/18/2022 0854   TRIG 110 01/18/2022 0854   HDL 31 (L) 01/18/2022 0854   CHOLHDL 3.4 01/18/2022 0854   CHOLHDL 3.3 10/04/2020 0936   VLDL 18 10/04/2020 0936   LDLCALC 54 01/18/2022 0854    Physical Exam:    VS:  BP 124/70 (BP Location: Right Arm, Patient Position: Sitting, Cuff Size: Normal)   Pulse 66   Ht 6' (1.829 m)   Wt 184 lb (83.5 kg)   SpO2 98%   BMI 24.95 kg/m     Wt Readings from Last 3 Encounters:  07/10/22 184 lb (83.5 kg)  01/03/22 208 lb (94.3 kg)  12/07/21 202 lb 6.4 oz (91.8 kg)     GEN:  Well nourished, well developed in  no acute distress HEENT: Normal NECK: No JVD; No carotid bruits LYMPHATICS: No lymphadenopathy CARDIAC: RRR, no murmurs, rubs, gallops RESPIRATORY:  Clear to auscultation without rales, wheezing or rhonchi  ABDOMEN: Soft, non-tender, non-distended MUSCULOSKELETAL:  No edema; No deformity  SKIN: Warm and dry NEUROLOGIC:  Alert and oriented x 3 PSYCHIATRIC:  Normal affect    Signed, Norman Herrlich, MD  07/10/2022 3:35 PM    [facility]

## 2022-07-10 ENCOUNTER — Encounter: Payer: Self-pay | Admitting: Cardiology

## 2022-07-10 ENCOUNTER — Ambulatory Visit: Payer: PPO | Attending: Cardiology | Admitting: Cardiology

## 2022-07-10 VITALS — BP 124/70 | HR 66 | Ht 72.0 in | Wt 184.0 lb

## 2022-07-10 DIAGNOSIS — E782 Mixed hyperlipidemia: Secondary | ICD-10-CM | POA: Diagnosis not present

## 2022-07-10 DIAGNOSIS — Z79899 Other long term (current) drug therapy: Secondary | ICD-10-CM | POA: Diagnosis not present

## 2022-07-10 DIAGNOSIS — I110 Hypertensive heart disease with heart failure: Secondary | ICD-10-CM | POA: Diagnosis not present

## 2022-07-10 DIAGNOSIS — Z7901 Long term (current) use of anticoagulants: Secondary | ICD-10-CM

## 2022-07-10 DIAGNOSIS — I480 Paroxysmal atrial fibrillation: Secondary | ICD-10-CM

## 2022-07-10 MED ORDER — VALSARTAN 160 MG PO TABS: 160.0000 mg | ORAL_TABLET | Freq: Every day | ORAL | 3 refills | Status: DC

## 2022-07-10 NOTE — Patient Instructions (Signed)
Medication Instructions:  Your physician has recommended you make the following change in your medication:   STOP: Valsartan/ hydrochlorothiazide START: Valsartan 160 mg daily   *If you need a refill on your cardiac medications before your next appointment, please call your pharmacy*   Lab Work: None If you have labs (blood work) drawn today and your tests are completely normal, you will receive your results only by: MyChart Message (if you have MyChart) OR A paper copy in the mail If you have any lab test that is abnormal or we need to change your treatment, we will call you to review the results.   Testing/Procedures: None   Follow-Up: At [facility], you and your health needs are our priority.  As part of our continuing mission to provide you with exceptional heart care, we have created designated Provider Care Teams.  These Care Teams include your primary Cardiologist (physician) and Advanced Practice Providers (APPs -  Physician Assistants and Nurse Practitioners) who all work together to provide you with the care you need, when you need it.  We recommend signing up for the patient portal called "MyChart".  Sign up information is provided on this After Visit Summary.  MyChart is used to connect with patients for Virtual Visits (Telemedicine).  Patients are able to view lab/test results, encounter notes, upcoming appointments, etc.  Non-urgent messages can be sent to your provider as well.   To learn more about what you can do with MyChart, go to [email or web address].    Your next appointment:   6 month(s)  Provider:   Norman Herrlich, MD    Other Instructions None

## 2022-08-24 DIAGNOSIS — R1030 Lower abdominal pain, unspecified: Secondary | ICD-10-CM | POA: Diagnosis not present

## 2022-08-24 DIAGNOSIS — R102 Pelvic and perineal pain: Secondary | ICD-10-CM | POA: Diagnosis not present

## 2022-08-24 DIAGNOSIS — N281 Cyst of kidney, acquired: Secondary | ICD-10-CM | POA: Diagnosis not present

## 2022-08-24 DIAGNOSIS — R509 Fever, unspecified: Secondary | ICD-10-CM | POA: Diagnosis not present

## 2022-08-24 DIAGNOSIS — K5732 Diverticulitis of large intestine without perforation or abscess without bleeding: Secondary | ICD-10-CM | POA: Diagnosis not present

## 2022-08-31 DIAGNOSIS — Z96651 Presence of right artificial knee joint: Secondary | ICD-10-CM | POA: Diagnosis not present

## 2022-09-19 DIAGNOSIS — L814 Other melanin hyperpigmentation: Secondary | ICD-10-CM | POA: Diagnosis not present

## 2022-09-19 DIAGNOSIS — C44729 Squamous cell carcinoma of skin of left lower limb, including hip: Secondary | ICD-10-CM | POA: Diagnosis not present

## 2022-09-19 DIAGNOSIS — L570 Actinic keratosis: Secondary | ICD-10-CM | POA: Diagnosis not present

## 2022-09-19 DIAGNOSIS — L578 Other skin changes due to chronic exposure to nonionizing radiation: Secondary | ICD-10-CM | POA: Diagnosis not present

## 2022-10-03 DIAGNOSIS — C44729 Squamous cell carcinoma of skin of left lower limb, including hip: Secondary | ICD-10-CM | POA: Diagnosis not present

## 2022-10-10 DIAGNOSIS — Z23 Encounter for immunization: Secondary | ICD-10-CM | POA: Diagnosis not present

## 2022-11-27 ENCOUNTER — Telehealth: Payer: Self-pay | Admitting: Cardiology

## 2022-11-27 NOTE — Telephone Encounter (Signed)
  Patient calling the office for samples of medication:   1.  What medication and dosage are you requesting samples for?  apixaban (ELIQUIS) 5 MG TABS tablet    2.  Are you currently out of this medication? Yes, Pt is in [place] and he is requesting if he can get 2 boxes of eliquis

## 2022-11-27 NOTE — Telephone Encounter (Signed)
Left message for the patient to call back.

## 2022-12-01 NOTE — Telephone Encounter (Signed)
Called patient and informed him that we did not have any samples of Eliquis at this time. Patient stated that he thought he had enough to get him through to the end of December but he wanted to see if he could get a couple boxes of samples just to make sure. Patient stated that he would call back in a few weeks to see if we had received any samples. Patient had no further questions at this time.

## 2022-12-17 ENCOUNTER — Other Ambulatory Visit: Payer: Self-pay | Admitting: Cardiology

## 2022-12-21 DIAGNOSIS — R0789 Other chest pain: Secondary | ICD-10-CM | POA: Diagnosis not present

## 2023-01-02 DIAGNOSIS — Z8673 Personal history of transient ischemic attack (TIA), and cerebral infarction without residual deficits: Secondary | ICD-10-CM | POA: Diagnosis not present

## 2023-01-02 DIAGNOSIS — I10 Essential (primary) hypertension: Secondary | ICD-10-CM | POA: Diagnosis not present

## 2023-01-02 DIAGNOSIS — G4733 Obstructive sleep apnea (adult) (pediatric): Secondary | ICD-10-CM | POA: Diagnosis not present

## 2023-01-15 ENCOUNTER — Telehealth: Payer: Self-pay | Admitting: Cardiology

## 2023-01-15 MED ORDER — APIXABAN 5 MG PO TABS: 5.0000 mg | ORAL_TABLET | Freq: Two times a day (BID) | ORAL | 1 refills | Status: DC

## 2023-01-15 NOTE — Telephone Encounter (Signed)
Pt last saw Dr Dulce Sellar 07/10/22, last labs 01/18/22 Creat 0.92, age 72, weight 83.5kg, based on specified criteria pt is on appropriate dosage of Eliquis 5mg  BID for afib.  Will refill rx.

## 2023-01-15 NOTE — Telephone Encounter (Signed)
*  STAT* If patient is at the pharmacy, call can be transferred to refill team.   1. Which medications need to be refilled? (please list name of each medication and dose if known) Eliquis   2. Would you like to learn more about the convenience, safety, & potential cost savings by using the [facility]?      3. Are you open to using the [facility] (Type [facility]. .   4. Which pharmacy/location (including street and city if local pharmacy) is medication to be sent to?[facility] [place]   5. Do they need a 30 day or 90 day supply? 90 days and refills- please call in today

## 2023-01-29 DIAGNOSIS — Z6826 Body mass index (BMI) 26.0-26.9, adult: Secondary | ICD-10-CM | POA: Diagnosis not present

## 2023-01-29 DIAGNOSIS — R251 Tremor, unspecified: Secondary | ICD-10-CM | POA: Diagnosis not present

## 2023-01-29 DIAGNOSIS — M545 Low back pain, unspecified: Secondary | ICD-10-CM | POA: Diagnosis not present

## 2023-01-29 DIAGNOSIS — M25532 Pain in left wrist: Secondary | ICD-10-CM | POA: Diagnosis not present

## 2023-01-29 DIAGNOSIS — M25512 Pain in left shoulder: Secondary | ICD-10-CM | POA: Diagnosis not present

## 2023-01-30 ENCOUNTER — Encounter: Payer: Self-pay | Admitting: Cardiology

## 2023-02-01 ENCOUNTER — Ambulatory Visit: Payer: PPO | Attending: Cardiology | Admitting: Cardiology

## 2023-02-01 ENCOUNTER — Encounter: Payer: Self-pay | Admitting: Cardiology

## 2023-02-01 VITALS — BP 142/72 | HR 69 | Ht 72.0 in | Wt 198.8 lb

## 2023-02-01 DIAGNOSIS — E782 Mixed hyperlipidemia: Secondary | ICD-10-CM | POA: Diagnosis not present

## 2023-02-01 DIAGNOSIS — I480 Paroxysmal atrial fibrillation: Secondary | ICD-10-CM

## 2023-02-01 DIAGNOSIS — Z7901 Long term (current) use of anticoagulants: Secondary | ICD-10-CM

## 2023-02-01 DIAGNOSIS — I25110 Atherosclerotic heart disease of native coronary artery with unstable angina pectoris: Secondary | ICD-10-CM | POA: Diagnosis not present

## 2023-02-01 DIAGNOSIS — Z79899 Other long term (current) drug therapy: Secondary | ICD-10-CM | POA: Diagnosis not present

## 2023-02-01 DIAGNOSIS — R002 Palpitations: Secondary | ICD-10-CM | POA: Diagnosis not present

## 2023-02-01 DIAGNOSIS — I119 Hypertensive heart disease without heart failure: Secondary | ICD-10-CM

## 2023-02-01 DIAGNOSIS — R079 Chest pain, unspecified: Secondary | ICD-10-CM

## 2023-02-01 MED ORDER — PANTOPRAZOLE SODIUM 40 MG PO TBEC: 40.0000 mg | DELAYED_RELEASE_TABLET | Freq: Every day | ORAL | 0 refills | Status: DC

## 2023-02-01 MED ORDER — METOPROLOL TARTRATE 100 MG PO TABS: 100.0000 mg | ORAL_TABLET | Freq: Once | ORAL | 0 refills | Status: DC

## 2023-02-01 NOTE — Progress Notes (Signed)
Cardiology Office Note:    Date:  02/01/2023   ID:  Benjamin Griffin, DOB 07-16-1950, MRN [id number]  PCP:  Marylen Ponto, MD  Cardiologist:  Norman Herrlich, MD    Referring MD: Marylen Ponto, MD    ASSESSMENT:    1. Chest pain of uncertain etiology   2. Paroxysmal atrial fibrillation (HCC)   3. High risk medication use   4. Chronic anticoagulation   5. Hypertensive heart disease, unspecified whether heart failure present   6. Coronary artery disease involving native coronary artery of native heart with unstable angina pectoris (HCC)   7. Mixed hyperlipidemia   8. Palpitations    PLAN:    In order of problems listed above:  Concern pattern initiate PPI avoid spicy foods and check cardiac CTA for progression of CAD For now continue his flecainide maintaining sinus rhythm no evidence of toxicity on EKG with his beta-blocker and anticoagulant Stable BP at target diuretic minutes of congestive heart failure Await results of CTA if he has severe flow-limiting stenosis will refer to coronary angiography revascularization Continue statin we will do a lipid panel today as well as Apo B optimize lipid-lowering   Next appointment: 6 months   Medication Adjustments/Labs and Tests Ordered: Current medicines are reviewed at length with the patient today.  Concerns regarding medicines are outlined above.  Orders Placed This Encounter  Procedures   EKG 12-Lead   No orders of the defined types were placed in this encounter.    History of Present Illness:    Benjamin Griffin is a 73 y.o. male with a hx of paroxysmal atrial fibrillation suppressed with flecainide therapy and on reduced dose anticoagulant hypertensive heart disease with heart failure hyperlipidemia and CAD with non-ST elevation MI 2022 with minor nonobstructive CAD last seen 07/10/2022.  Subsequent echocardiogram April 2023 showed normal left ventricular function.  Compliance with diet, lifestyle and medications:  Yes  Initially the impression was everything was good not having episodes of atrial fibrillation tolerates his anticoagulant and flecainide. He and his wife walk every day sometimes up to 2 miles That he tells me has been having severe heartburn episodes have also occurred with physical effort and he had an episode at [facility] where he had to stop rest and wondered if he was having a heart attack He has CAD he will be set up for cardiac CTA to look for progression In the interim avoid spicy foods and given a prescription 1 month Onyx he does also notice relief with Tums Past Medical History:  Diagnosis Date   Arthritis    Atrial fibrillation with rapid ventricular response (HCC) 04/21/2020   Cancer (HCC)    skin cancer, removed from bilateral arms   DDD (degenerative disc disease), lumbar 04/21/2020   Dyslipidemia 04/21/2020   History of kidney stones    Hyperlipidemia    Hypertension    Hypertensive heart disease with heart failure (HCC) 03/02/2020   Hypertrophy of bone, right ankle and foot 06/03/2019   Lumbar radiculopathy 01/01/2020   Mild non-obstructive CAD 10/05/2020   NSTEMI (non-ST elevated myocardial infarction) (HCC) 10/03/2020   Palpitations    Right inguinal hernia 06/06/2022   Sleep apnea    CPAP   Stroke (HCC)    approx 2000, on plavix. When found on CT, was told it was an "old stroke"    Current Medications: Current Meds  Medication Sig   apixaban (ELIQUIS) 5 MG TABS tablet Take 1 tablet (5 mg total) by mouth 2 (two)  times daily.   carvedilol (COREG) 6.25 MG tablet Take 1 tablet (6.25 mg total) by mouth 2 (two) times daily.   fenofibrate micronized (LOFIBRA) 134 MG capsule Take 134 mg by mouth daily.   flecainide (TAMBOCOR) 50 MG tablet Take 1 tablet (50 mg total) by mouth 2 (two) times daily.   nitroGLYCERIN (NITROSTAT) 0.4 MG SL tablet Place 0.4 mg under the tongue every 5 (five) minutes as needed for chest pain.   simvastatin (ZOCOR) 20 MG tablet Take 20 mg by  mouth daily.   valsartan (DIOVAN) 160 MG tablet Take 1 tablet (160 mg total) by mouth daily.      EKGs/Labs/Other Studies Reviewed:         EKG Interpretation Date/Time:  Thursday February 01 2023 13:30:34 EST Ventricular Rate:  69 PR Interval:  216 QRS Duration:  114 QT Interval:  424 QTC Calculation: 454 R Axis:   -23  Text Interpretation: Sinus rhythm with 1st degree A-V block Incomplete right bundle branch block Nonspecific T wave abnormality When compared with ECG of 10-Jul-2022 15:17, No significant change was found Confirmed by Norman Herrlich ([id number]) on 02/01/2023 1:35:56 PM   Recent Labs: 04/18/2022: Hemoglobin 11.7; Platelets 155  Recent Lipid Panel    Component Value Date/Time   CHOL 106 01/18/2022 0854   TRIG 110 01/18/2022 0854   HDL 31 (L) 01/18/2022 0854   CHOLHDL 3.4 01/18/2022 0854   CHOLHDL 3.3 10/04/2020 0936   VLDL 18 10/04/2020 0936   LDLCALC 54 01/18/2022 0854    Physical Exam:    VS:  BP (!) 142/72   Pulse 69   Ht 6' (1.829 m)   Wt 198 lb 12.8 oz (90.2 kg)   SpO2 98%   BMI 26.96 kg/m     Wt Readings from Last 3 Encounters:  02/01/23 198 lb 12.8 oz (90.2 kg)  07/10/22 184 lb (83.5 kg)  01/03/22 208 lb (94.3 kg)     GEN:  Well nourished, well developed in no acute distress HEENT: Normal NECK: No JVD; No carotid bruits LYMPHATICS: No lymphadenopathy CARDIAC: RRR, no murmurs, rubs, gallops RESPIRATORY:  Clear to auscultation without rales, wheezing or rhonchi  ABDOMEN: Soft, non-tender, non-distended MUSCULOSKELETAL:  No edema; No deformity  SKIN: Warm and dry NEUROLOGIC:  Alert and oriented x 3 PSYCHIATRIC:  Normal affect    Signed, Norman Herrlich, MD  02/01/2023 1:49 PM    [facility]

## 2023-02-01 NOTE — Patient Instructions (Addendum)
Medication Instructions:  Your physician has recommended you make the following change in your medication:   START: Protonix 40 mg daily  *If you need a refill on your cardiac medications before your next appointment, please call your pharmacy*   Lab Work: Your physician recommends that you return for lab work in:   Labs today: BMP, Lipids, Apo B  If you have labs (blood work) drawn today and your tests are completely normal, you will receive your results only by: MyChart Message (if you have MyChart) OR A paper copy in the mail If you have any lab test that is abnormal or we need to change your treatment, we will call you to review the results.   Testing/Procedures:   Your cardiac CT will be scheduled at one of the below locations:   [facility] [address] [place], [place] [address] [phone]  OR  [facility] [address] [place], [place] [address] [phone]  OR   [facility] [address] [place], [place] [address] [phone]  OR   [facility] [address] [place], [place] [address] [phone]  If scheduled at [facility], please arrive at the [facility] and Children's Entrance (Entrance C2) of [facility] 30 minutes prior to test start time. You can use the FREE valet parking offered at entrance C (encouraged to control the heart rate for the test)  Proceed to the [facility] Radiology Department (first floor) to check-in and test prep.  All radiology patients and guests should use entrance C2 at [facility], accessed from [facility], even though the hospital's physical address listed is [address].    If scheduled at [facility] or [facility], please arrive 15 mins early for check-in and test prep.  There is spacious parking and easy access to the  radiology department from the [facility] V[facility] entrance. Please enter here and check-in with the desk attendant.   Please follow these instructions carefully (unless otherwise directed):  An IV will be required for this test and Nitroglycerin will be given.  Hold all erectile dysfunction medications at least 3 days (72 hrs) prior to test. (Ie viagra, cialis, sildenafil, tadalafil, etc)   On the Night Before the Test: Be sure to Drink plenty of water. Do not consume any caffeinated/decaffeinated beverages or chocolate 12 hours prior to your test. Do not take any antihistamines 12 hours prior to your test.  On the Day of the Test: Drink plenty of water until 1 hour prior to the test. Do not eat any food 1 hour prior to test. You may take your regular medications prior to the test.  Take metoprolol (Lopressor) two hours prior to test.      After the Test: Drink plenty of water. After receiving IV contrast, you may experience a mild flushed feeling. This is normal. On occasion, you may experience a mild rash up to 24 hours after the test. This is not dangerous. If this occurs, you can take Benadryl 25 mg and increase your fluid intake. If you experience trouble breathing, this can be serious. If it is severe call 911 IMMEDIATELY. If it is mild, please call our office.  We will call to schedule your test 2-4 weeks out understanding that some insurance companies will need an authorization prior to the service being performed.   For more information and frequently  asked questions, please visit our website : [email or web address]  For non-scheduling related questions, please contact the cardiac imaging nurse navigator should you have any questions/concerns: Cardiac Imaging Nurse Navigators Direct Office Dial: [phone]   For scheduling needs, including cancellations and rescheduling, please call [place], [phone].    Follow-Up: At [facility], you and  your health needs are our priority.  As part of our continuing mission to provide you with exceptional heart care, we have created designated Provider Care Teams.  These Care Teams include your primary Cardiologist (physician) and Advanced Practice Providers (APPs -  Physician Assistants and Nurse Practitioners) who all work together to provide you with the care you need, when you need it.  We recommend signing up for the patient portal called "MyChart".  Sign up information is provided on this After Visit Summary.  MyChart is used to connect with patients for Virtual Visits (Telemedicine).  Patients are able to view lab/test results, encounter notes, upcoming appointments, etc.  Non-urgent messages can be sent to your provider as well.   To learn more about what you can do with MyChart, go to [email or web address].    Your next appointment:   6 month(s)  Provider:   Norman Herrlich, MD    Other Instructions None

## 2023-02-02 LAB — BASIC METABOLIC PANEL
BUN/Creatinine Ratio: 12 (ref 10–24)
BUN: 11 mg/dL (ref 8–27)
CO2: 20 mmol/L (ref 20–29)
Calcium: 9.9 mg/dL (ref 8.6–10.2)
Chloride: 107 mmol/L — ABNORMAL HIGH (ref 96–106)
Creatinine, Ser: 0.91 mg/dL (ref 0.76–1.27)
Glucose: 117 mg/dL — ABNORMAL HIGH (ref 70–99)
Potassium: 4.1 mmol/L (ref 3.5–5.2)
Sodium: 143 mmol/L (ref 134–144)
eGFR: 90 mL/min/{1.73_m2} (ref >59)

## 2023-02-02 LAB — LIPID PANEL
Chol/HDL Ratio: 4.1 {ratio} (ref 0.0–5.0)
Cholesterol, Total: 110 mg/dL (ref 100–199)
HDL: 27 mg/dL — ABNORMAL LOW (ref >39)
LDL Chol Calc (NIH): 49 mg/dL (ref 0–99)
Triglycerides: 211 mg/dL — ABNORMAL HIGH (ref 0–149)
VLDL Cholesterol Cal: 34 mg/dL (ref 5–40)

## 2023-02-02 LAB — APOLIPOPROTEIN B: Apolipoprotein B: 52 mg/dL (ref <90)

## 2023-02-14 ENCOUNTER — Telehealth (HOSPITAL_COMMUNITY): Payer: Self-pay | Admitting: *Deleted

## 2023-02-14 NOTE — Telephone Encounter (Signed)
Attempted to call patient regarding upcoming cardiac CT appointment. Left message on voicemail with name and callback number Johney Frame RN Navigator Cardiac Imaging [facility] Services [phone] Office

## 2023-02-14 NOTE — Telephone Encounter (Signed)
Received call from patient regarding upcoming cardiac imaging study; pt verbalizes understanding of appt date/time, parking situation and where to check in, pre-test NPO status and medications ordered, and verified current allergies; name and call back number provided for further questions should they arise Johney Frame RN Navigator Cardiac Imaging [facility] [phone] office [phone] cell

## 2023-02-15 ENCOUNTER — Ambulatory Visit (HOSPITAL_COMMUNITY)
Admission: RE | Admit: 2023-02-15 | Discharge: 2023-02-15 | Disposition: A | Discharge status: Home / Self Care | Payer: PPO | Source: Ambulatory Visit | Attending: Cardiology | Admitting: Cardiology

## 2023-02-15 DIAGNOSIS — E782 Mixed hyperlipidemia: Secondary | ICD-10-CM | POA: Diagnosis not present

## 2023-02-15 DIAGNOSIS — I119 Hypertensive heart disease without heart failure: Secondary | ICD-10-CM | POA: Insufficient documentation

## 2023-02-15 DIAGNOSIS — Z79899 Other long term (current) drug therapy: Secondary | ICD-10-CM | POA: Diagnosis not present

## 2023-02-15 DIAGNOSIS — R079 Chest pain, unspecified: Secondary | ICD-10-CM | POA: Diagnosis not present

## 2023-02-15 DIAGNOSIS — Z7901 Long term (current) use of anticoagulants: Secondary | ICD-10-CM | POA: Insufficient documentation

## 2023-02-15 DIAGNOSIS — I480 Paroxysmal atrial fibrillation: Secondary | ICD-10-CM | POA: Diagnosis not present

## 2023-02-15 DIAGNOSIS — I25110 Atherosclerotic heart disease of native coronary artery with unstable angina pectoris: Secondary | ICD-10-CM | POA: Insufficient documentation

## 2023-02-15 DIAGNOSIS — R002 Palpitations: Secondary | ICD-10-CM | POA: Diagnosis not present

## 2023-02-15 MED ORDER — NITROGLYCERIN 0.4 MG SL SUBL
0.8000 mg | SUBLINGUAL_TABLET | Freq: Once | SUBLINGUAL | Status: AC
  Administered 2023-02-15: 0.8 mg via SUBLINGUAL

## 2023-02-15 MED ORDER — NITROGLYCERIN 0.4 MG SL SUBL
SUBLINGUAL_TABLET | SUBLINGUAL | Status: AC
  Filled 2023-02-15: qty 2

## 2023-02-15 MED ORDER — IOHEXOL 350 MG/ML SOLN
190.0000 mL | Freq: Once | INTRAVENOUS | Status: AC | PRN
  Administered 2023-02-15: 190 mL via INTRAVENOUS

## 2023-02-20 ENCOUNTER — Telehealth: Payer: Self-pay | Admitting: *Deleted

## 2023-02-20 NOTE — Telephone Encounter (Signed)
Called and spoke with patient, he states he wears his CPAP machine every night and his machine does have an SD card in it.  He will bring his machine for [place] to get the card out to get the data.  Nothing further needed.

## 2023-02-21 ENCOUNTER — Encounter: Payer: Self-pay | Admitting: Pulmonary Disease

## 2023-02-21 ENCOUNTER — Ambulatory Visit: Payer: PPO | Admitting: Pulmonary Disease

## 2023-02-21 VITALS — BP 150/70 | HR 69 | Temp 97.9°F | Ht 72.0 in | Wt 195.8 lb

## 2023-02-21 DIAGNOSIS — G4733 Obstructive sleep apnea (adult) (pediatric): Secondary | ICD-10-CM

## 2023-02-21 NOTE — Progress Notes (Signed)
 Benjamin Griffin    [id number]    [birth year]  Primary Care Physician:Holt, Marcellus RAMAN, MD  Referring Physician: Ina Marcellus RAMAN, MD [address] [place] [address],   Chief complaint:   Patient with mild obstructive sleep apnea CPAP is working well  HPI:  Continues to tolerate CPAP well Sleeping well Functioning well  Has no significant concerns about his CPAP  Waking up feeling like he is at a good nights rest  He has a history of coronary artery disease, history of stroke and atrial fibrillation  Feels well rested  Reformed smoker Denies any ongoing symptoms at present   Outpatient Encounter Medications as of 02/21/2023  Medication Sig   apixaban  (ELIQUIS ) 5 MG TABS tablet Take 1 tablet (5 mg total) by mouth 2 (two) times daily.   carvedilol  (COREG ) 6.25 MG tablet Take 1 tablet (6.25 mg total) by mouth 2 (two) times daily.   fenofibrate  micronized (LOFIBRA) 134 MG capsule Take 134 mg by mouth daily.   flecainide  (TAMBOCOR ) 50 MG tablet Take 1 tablet (50 mg total) by mouth 2 (two) times daily.   nitroGLYCERIN  (NITROSTAT ) 0.4 MG SL tablet Place 0.4 mg under the tongue every 5 (five) minutes as needed for chest pain.   pantoprazole  (PROTONIX ) 40 MG tablet Take 1 tablet (40 mg total) by mouth daily.   simvastatin  (ZOCOR ) 20 MG tablet Take 20 mg by mouth daily.   valsartan  (DIOVAN ) 160 MG tablet Take 1 tablet (160 mg total) by mouth daily.   amLODipine  (NORVASC ) 5 MG tablet Take 1 tablet (5 mg total) by mouth daily.   metoprolol  tartrate (LOPRESSOR ) 100 MG tablet Take 1 tablet (100 mg total) by mouth once for 1 dose. Please take this medication 2 hours before CT   No facility-administered encounter medications on file as of 02/21/2023.    Allergies as of 02/21/2023 - Review Complete 02/21/2023  Allergen Reaction Noted   Meperidine Other (See Comments) 06/03/2019   Vicodin [hydrocodone-acetaminophen ] Nausea And Vomiting 12/24/2019    Past Medical History:   Diagnosis Date   Arthritis    Atrial fibrillation with rapid ventricular response (HCC) 04/21/2020   Cancer (HCC)    skin cancer, removed from bilateral arms   DDD (degenerative disc disease), lumbar 04/21/2020   Dyslipidemia 04/21/2020   History of kidney stones    Hyperlipidemia    Hypertension    Hypertensive heart disease with heart failure (HCC) 03/02/2020   Hypertrophy of bone, right ankle and foot 06/03/2019   Lumbar radiculopathy 01/01/2020   Mild non-obstructive CAD 10/05/2020   NSTEMI (non-ST elevated myocardial infarction) (HCC) 10/03/2020   Palpitations    Right inguinal hernia 06/06/2022   Sleep apnea    CPAP   Stroke (HCC)    approx 2000, on plavix. When found on CT, was told it was an old stroke    Past Surgical History:  Procedure Laterality Date   APPENDECTOMY     CARDIAC CATHETERIZATION     approx year 2000 @ [facility] after stroke   CATARACT EXTRACTION, BILATERAL     EYE SURGERY     HERNIA REPAIR     LEFT HEART CATH AND CORONARY ANGIOGRAPHY N/A 10/04/2020   Procedure: LEFT HEART CATH AND CORONARY ANGIOGRAPHY;  Surgeon: Wendel Lurena POUR, MD;  Location: [facility] INVASIVE CV LAB;  Service: Cardiovascular;  Laterality: N/A;   LOOP RECORDER INSERTION     approx 2000    LOOP RECORDER REMOVAL  1 week after instertion, approx 2000   NASAL SINUS SURGERY     TRANSFORAMINAL LUMBAR INTERBODY FUSION W/ MIS 1 LEVEL N/A 01/01/2020   Procedure: TRANSFORAMINAL LUMBAR INTERBODY FUSION (TLIF) L4-L5;  Surgeon: Debby Dorn MATSU, MD;  Location: [facility] OR;  Service: Neurosurgery;  Laterality: N/A;  TRANSFORAMINAL LUMBAR INTERBODY FUSION (TLIF) L4-L5    UMBILICAL HERNIA REPAIR      Family History  Problem Relation Age of Onset   Lymphoma Mother    Atrial fibrillation Mother    Liver cancer Father    Hypertension Sister    Hypertension Brother    Hypertension Brother    Hypertension Brother    Hypertension Brother    Hypertension Sister    Hypertension Sister      Social History   Socioeconomic History   Marital status: Married    Spouse name: Not on file   Number of children: Not on file   Years of education: Not on file   Highest education level: Not on file  Occupational History   Not on file  Tobacco Use   Smoking status: Former    Current packs/day: 0.00    Average packs/day: 0.5 packs/day for 25.0 years (12.5 ttl pk-yrs)    Types: Cigarettes    Start date: 12/16/1973    Quit date: 12/17/1998    Years since quitting: 24.1   Smokeless tobacco: Never   Tobacco comments:    quit in 2000  Vaping Use   Vaping status: Never Used  Substance and Sexual Activity   Alcohol use: Not Currently    Comment: 1 beer occasionally   Drug use: Never   Sexual activity: Not on file  Other Topics Concern   Not on file  Social History Narrative   ** Merged History Encounter **       Social Drivers of Corporate Investment Banker Strain: Not on file  Food Insecurity: Not on file  Transportation Needs: Not on file  Physical Activity: Not on file  Stress: Not on file  Social Connections: Not on file  Intimate Partner Violence: Not on file    Review of Systems  Constitutional:  Negative for fatigue.  Respiratory:  Positive for apnea. Negative for shortness of breath.   Psychiatric/Behavioral:  Positive for sleep disturbance.     Vitals:   02/21/23 1108  BP: (!) 150/70  Pulse: 69  Temp: 97.9 F (36.6 C)  SpO2: 98%     Physical Exam Constitutional:      Appearance: He is obese.  HENT:     Head: Normocephalic and atraumatic.     Nose: Nose normal.     Mouth/Throat:     Mouth: Mucous membranes are moist.     Comments: Mallampati 3, crowded oropharynx Eyes:     General:        Right eye: No discharge.        Left eye: No discharge.     Pupils: Pupils are equal, round, and reactive to light.  Cardiovascular:     Rate and Rhythm: Normal rate and regular rhythm.     Heart sounds: No murmur heard.    No friction rub.   Pulmonary:     Effort: No respiratory distress.     Breath sounds: No stridor. No wheezing or rhonchi.  Musculoskeletal:     Cervical back: No tenderness.  Neurological:     Mental Status: He is alert.  Psychiatric:        Mood and Affect:  Mood normal.    Data Reviewed: Sleep study results showing mild obstructive sleep apnea  Download from the machine shows excellent compliance Average use of 9 hours 35 minutes AutoSet 8-15 Residual AHI 0.8  Assessment:  Mild obstructive sleep apnea -Compliant with CPAP use  History of hypertension History of CVA History of atrial fibrillation   Plan/Recommendations:  Continue nightly CPAP  Encouraged to call with significant concerns  Follow-up a year from now  Encouraged graded activities as tolerated  Jennet Epley MD [facility] 02/21/2023, 11:17 AM  CC: Ina Marcellus RAMAN, MD

## 2023-02-21 NOTE — Patient Instructions (Signed)
 Continue using CPAP nightly  The download from the machine shows it is working very well  Call us  with significant concerns  Will see you a year from now

## 2023-02-25 ENCOUNTER — Other Ambulatory Visit: Payer: Self-pay | Admitting: Cardiology

## 2023-02-27 ENCOUNTER — Other Ambulatory Visit: Payer: Self-pay | Admitting: Cardiology

## 2023-02-28 ENCOUNTER — Telehealth: Payer: Self-pay | Admitting: Cardiology

## 2023-02-28 NOTE — Telephone Encounter (Signed)
Overread report taken and sent to Dr. Dulce Sellar

## 2023-02-28 NOTE — Telephone Encounter (Signed)
CT results, call transferred.

## 2023-03-20 DIAGNOSIS — C44729 Squamous cell carcinoma of skin of left lower limb, including hip: Secondary | ICD-10-CM | POA: Diagnosis not present

## 2023-03-20 DIAGNOSIS — C44519 Basal cell carcinoma of skin of other part of trunk: Secondary | ICD-10-CM | POA: Diagnosis not present

## 2023-03-20 DIAGNOSIS — L820 Inflamed seborrheic keratosis: Secondary | ICD-10-CM | POA: Diagnosis not present

## 2023-03-26 DIAGNOSIS — I10 Essential (primary) hypertension: Secondary | ICD-10-CM | POA: Diagnosis not present

## 2023-03-26 DIAGNOSIS — Z125 Encounter for screening for malignant neoplasm of prostate: Secondary | ICD-10-CM | POA: Diagnosis not present

## 2023-03-26 DIAGNOSIS — E7800 Pure hypercholesterolemia, unspecified: Secondary | ICD-10-CM | POA: Diagnosis not present

## 2023-03-26 DIAGNOSIS — Z0000 Encounter for general adult medical examination without abnormal findings: Secondary | ICD-10-CM | POA: Diagnosis not present

## 2023-03-26 DIAGNOSIS — Z6826 Body mass index (BMI) 26.0-26.9, adult: Secondary | ICD-10-CM | POA: Diagnosis not present

## 2023-03-26 DIAGNOSIS — Z79899 Other long term (current) drug therapy: Secondary | ICD-10-CM | POA: Diagnosis not present

## 2023-03-26 DIAGNOSIS — Z1331 Encounter for screening for depression: Secondary | ICD-10-CM | POA: Diagnosis not present

## 2023-03-26 DIAGNOSIS — Z1339 Encounter for screening examination for other mental health and behavioral disorders: Secondary | ICD-10-CM | POA: Diagnosis not present

## 2023-03-26 LAB — LAB REPORT - SCANNED

## 2023-04-03 DIAGNOSIS — G4733 Obstructive sleep apnea (adult) (pediatric): Secondary | ICD-10-CM | POA: Diagnosis not present

## 2023-04-03 DIAGNOSIS — I10 Essential (primary) hypertension: Secondary | ICD-10-CM | POA: Diagnosis not present

## 2023-04-03 DIAGNOSIS — Z8673 Personal history of transient ischemic attack (TIA), and cerebral infarction without residual deficits: Secondary | ICD-10-CM | POA: Diagnosis not present

## 2023-04-10 DIAGNOSIS — C44529 Squamous cell carcinoma of skin of other part of trunk: Secondary | ICD-10-CM | POA: Diagnosis not present

## 2023-04-20 DIAGNOSIS — S39012A Strain of muscle, fascia and tendon of lower back, initial encounter: Secondary | ICD-10-CM | POA: Diagnosis not present

## 2023-04-20 DIAGNOSIS — I10 Essential (primary) hypertension: Secondary | ICD-10-CM | POA: Diagnosis not present

## 2023-04-20 DIAGNOSIS — M549 Dorsalgia, unspecified: Secondary | ICD-10-CM | POA: Diagnosis not present

## 2023-04-20 DIAGNOSIS — R109 Unspecified abdominal pain: Secondary | ICD-10-CM | POA: Diagnosis not present

## 2023-04-20 DIAGNOSIS — M545 Low back pain, unspecified: Secondary | ICD-10-CM | POA: Diagnosis not present

## 2023-04-20 DIAGNOSIS — X58XXXA Exposure to other specified factors, initial encounter: Secondary | ICD-10-CM | POA: Diagnosis not present

## 2023-04-21 DIAGNOSIS — X58XXXA Exposure to other specified factors, initial encounter: Secondary | ICD-10-CM | POA: Diagnosis not present

## 2023-04-21 DIAGNOSIS — R109 Unspecified abdominal pain: Secondary | ICD-10-CM | POA: Diagnosis not present

## 2023-04-21 DIAGNOSIS — S39012A Strain of muscle, fascia and tendon of lower back, initial encounter: Secondary | ICD-10-CM | POA: Diagnosis not present

## 2023-05-10 ENCOUNTER — Ambulatory Visit: Payer: PPO | Admitting: Neurology

## 2023-05-10 ENCOUNTER — Encounter: Payer: Self-pay | Admitting: Neurology

## 2023-05-10 ENCOUNTER — Telehealth: Payer: Self-pay | Admitting: Neurology

## 2023-05-10 VITALS — BP 120/69 | HR 69 | Ht 72.0 in | Wt 186.0 lb

## 2023-05-10 DIAGNOSIS — G20A1 Parkinson's disease without dyskinesia, without mention of fluctuations: Secondary | ICD-10-CM

## 2023-05-10 HISTORY — DX: Parkinson's disease without dyskinesia, without mention of fluctuations: G20.A1

## 2023-05-10 MED ORDER — RASAGILINE MESYLATE 0.5 MG PO TABS: 0.5000 mg | ORAL_TABLET | Freq: Every day | ORAL | 11 refills | Status: DC

## 2023-05-10 NOTE — Telephone Encounter (Signed)
 no auth required sent to [facility] [phone]

## 2023-05-10 NOTE — Progress Notes (Signed)
 Chief Complaint  Patient presents with   New Patient (Initial Visit)    Pt in 15, here with wife Genevia Kern  Pt is referred for Tremor. Pt states the tremor is on his left arm/hand and started 6 months ago.       ASSESSMENT AND PLAN  Benjamin Griffin is a 73 y.o. male   Idiopathic Parkinson's disease  MRI of the brain  Azilect  0.5 mg daily   Encouraging moderate exercise  Return to clinic in 6 to 9 months   DIAGNOSTIC DATA (LABS, IMAGING, TESTING) - I reviewed patient records, labs, notes, testing and imaging myself where available. Laboratory in March 2024: Normal CBC hemoglobin of 13.1, CMP, creatinine of 0.9, LDL 43, normal TSH, PSA of 0.6  MEDICAL HISTORY:  Benjamin Griffin is a 73 year old male, seen in request by his primary care from [facility] Physician Dr.  Geraldo Klippel, Coralie Derrick for evaluation of right hand tremor, initial evaluation May 10, 2023    History is obtained from the patient and review of electronic medical records. I personally reviewed pertinent available imaging films in PACS.   PMHx of  HTN HLD A fib Stroke CAD  Since 2024, he noticed intermittent right hand tremor, noticed when he rest his right hand, he denies difficulty walking, had history of right knee replacement, still active in his yard, retired from [employer]  His brother and father suffered Alzheimer's disease, he denies significant memory loss  He denied REM sleep disorder, no constipation, no orthostatic dizziness, to have decreased sense of smell  PHYSICAL EXAM:   Vitals:   05/10/23 1342  BP: 120/69  Pulse: 69  Weight: 186 lb (84.4 kg)  Height: 6' (1.829 m)   Not recorded     Body mass index is 25.23 kg/m.  PHYSICAL EXAMNIATION:  Gen: NAD, conversant, well nourised, well groomed                     Cardiovascular: Regular rate rhythm, no peripheral edema, warm, nontender. Eyes: Conjunctivae clear without exudates or hemorrhage Neck: Supple, no carotid  bruits. Pulmonary: Clear to auscultation bilaterally   NEUROLOGICAL EXAM:  MENTAL STATUS: Speech/cognition: Awake, alert, oriented to history taking and casual conversation CRANIAL NERVES: CN II: Visual fields are full to confrontation. Pupils are round equal and briskly reactive to light. CN III, IV, VI: extraocular movement are normal. No ptosis. CN V: Facial sensation is intact to light touch CN VII: Face is symmetric with normal eye closure  CN VIII: Hearing is normal to causal conversation. CN IX, X: Phonation is normal. CN XI: Head turning and shoulder shrug are intact  MOTOR: Right hand resting tremor, right more than left mild to moderate rigidity, bradykinesia  REFLEXES: Reflexes are 1 and symmetric at the biceps, triceps, knees, and ankles. Plantar responses are flexor.  SENSORY: Intact to light touch, pinprick and vibratory sensation are intact in fingers and toes.  COORDINATION: There is no trunk or limb dysmetria noted.  GAIT/STANCE: Able to get up arm crossed, decreased right arm swing, moderate stride,  REVIEW OF SYSTEMS:  Full 14 system review of systems performed and notable only for as above All other review of systems were negative.   ALLERGIES: Allergies  Allergen Reactions   Meperidine Other (See Comments)    Sweating    Vicodin [Hydrocodone-Acetaminophen ] Nausea And Vomiting    HOME MEDICATIONS: Current Outpatient Medications  Medication Sig Dispense Refill   amLODipine  (NORVASC ) 5 MG tablet Take 1 tablet (5  mg total) by mouth daily. 90 tablet 3   apixaban  (ELIQUIS ) 5 MG TABS tablet Take 1 tablet (5 mg total) by mouth 2 (two) times daily. 180 tablet 1   carvedilol  (COREG ) 6.25 MG tablet Take 1 tablet (6.25 mg total) by mouth 2 (two) times daily. 180 tablet 1   fenofibrate  micronized (LOFIBRA) 134 MG capsule Take 134 mg by mouth daily.     flecainide  (TAMBOCOR ) 50 MG tablet Take 1 tablet by mouth twice daily 180 tablet 0   simvastatin  (ZOCOR ) 20  MG tablet Take 20 mg by mouth daily.     valsartan  (DIOVAN ) 160 MG tablet Take 1 tablet (160 mg total) by mouth daily. 90 tablet 3   metoprolol  tartrate (LOPRESSOR ) 100 MG tablet Take 1 tablet (100 mg total) by mouth once for 1 dose. Please take this medication 2 hours before CT 1 tablet 0   nitroGLYCERIN  (NITROSTAT ) 0.4 MG SL tablet Place 0.4 mg under the tongue every 5 (five) minutes as needed for chest pain. (Patient not taking: Reported on 05/10/2023)     No current facility-administered medications for this visit.    PAST MEDICAL HISTORY: Past Medical History:  Diagnosis Date   Arthritis    Atrial fibrillation with rapid ventricular response (HCC) 04/21/2020   Cancer (HCC)    skin cancer, removed from bilateral arms   DDD (degenerative disc disease), lumbar 04/21/2020   Dyslipidemia 04/21/2020   History of kidney stones    Hyperlipidemia    Hypertension    Hypertensive heart disease with heart failure (HCC) 03/02/2020   Hypertrophy of bone, right ankle and foot 06/03/2019   Lumbar radiculopathy 01/01/2020   Mild non-obstructive CAD 10/05/2020   NSTEMI (non-ST elevated myocardial infarction) (HCC) 10/03/2020   Palpitations    Right inguinal hernia 06/06/2022   Sleep apnea    CPAP   Stroke (HCC)    approx 2000, on plavix. When found on CT, was told it was an "old stroke"    PAST SURGICAL HISTORY: Past Surgical History:  Procedure Laterality Date   APPENDECTOMY     CARDIAC CATHETERIZATION     approx year 2000 @ [place] after stroke   CATARACT EXTRACTION, BILATERAL     EYE SURGERY     HERNIA REPAIR     LEFT HEART CATH AND CORONARY ANGIOGRAPHY N/A 10/04/2020   Procedure: LEFT HEART CATH AND CORONARY ANGIOGRAPHY;  Surgeon: Kyra Phy, MD;  Location: [facility]IVE [facility] LAB;  Service: Cardiovascular;  Laterality: N/A;   LOOP RECORDER INSERTION     approx 2000    LOOP RECORDER REMOVAL     1 week after instertion, approx 2000   NASAL SINUS SURGERY     TRANSFORAMINAL  LUMBAR INTERBODY FUSION W/ MIS 1 LEVEL N/A 01/01/2020   Procedure: TRANSFORAMINAL LUMBAR INTERBODY FUSION (TLIF) L4-L5;  Surgeon: Van Gelinas, MD;  Location: [facility] OR;  Service: Neurosurgery;  Laterality: N/A;  TRANSFORAMINAL LUMBAR INTERBODY FUSION (TLIF) L4-L5    UMBILICAL HERNIA REPAIR      FAMILY HISTORY: Family History  Problem Relation Age of Onset   Lymphoma Mother    Atrial fibrillation Mother    Liver cancer Father    Hypertension Sister    Hypertension Brother    Hypertension Brother    Hypertension Brother    Hypertension Brother    Hypertension Sister    Hypertension Sister     SOCIAL HISTORY: Social History   Socioeconomic History   Marital status: Married    Spouse  name: Not on file   Number of children: Not on file   Years of education: Not on file   Highest education level: Not on file  Occupational History   Not on file  Tobacco Use   Smoking status: Former    Current packs/day: 0.00    Average packs/day: 0.5 packs/day for 25.0 years (12.5 ttl pk-yrs)    Types: Cigarettes    Start date: 12/16/1973    Quit date: 12/17/1998    Years since quitting: 24.4   Smokeless tobacco: Never   Tobacco comments:    quit in 2000  Vaping Use   Vaping status: Never Used  Substance and Sexual Activity   Alcohol use: Not Currently    Comment: 1 beer occasionally   Drug use: Never   Sexual activity: Not on file  Other Topics Concern   Not on file  Social History Narrative   ** Merged History Encounter **       Social Drivers of Corporate investment banker Strain: Not on file  Food Insecurity: Not on file  Transportation Needs: Not on file  Physical Activity: Not on file  Stress: Not on file  Social Connections: Not on file  Intimate Partner Violence: Not on file      Phebe Brasil, M.D. Ph.D.  [facility] [address] [place], [place] [address] Ph: [phone] Fax: [phone]  CC:  Gaither Juba, MD [address] [address],   Gaither Juba, MD

## 2023-05-19 ENCOUNTER — Other Ambulatory Visit

## 2023-05-20 ENCOUNTER — Other Ambulatory Visit: Payer: Self-pay | Admitting: Cardiology

## 2023-05-20 ENCOUNTER — Ambulatory Visit
Admission: RE | Admit: 2023-05-20 | Discharge: 2023-05-20 | Disposition: A | Discharge status: Home / Self Care | Source: Ambulatory Visit | Attending: Neurology | Admitting: Neurology

## 2023-05-20 DIAGNOSIS — G20A1 Parkinson's disease without dyskinesia, without mention of fluctuations: Secondary | ICD-10-CM | POA: Diagnosis not present

## 2023-05-22 ENCOUNTER — Other Ambulatory Visit

## 2023-05-24 DIAGNOSIS — Z96651 Presence of right artificial knee joint: Secondary | ICD-10-CM | POA: Diagnosis not present

## 2023-05-29 ENCOUNTER — Telehealth: Payer: Self-pay | Admitting: Neurology

## 2023-05-29 NOTE — Telephone Encounter (Signed)
 Pt wife(Sharon) called for MRI Results .

## 2023-05-29 NOTE — Telephone Encounter (Signed)
   I called back. Spoke w/ wife and pt. Relayed results above. They verbalized understanding. They will keep appt for 01/2024 and call if he develops any new or worsening sx prior to next appt.

## 2023-06-10 ENCOUNTER — Other Ambulatory Visit: Payer: Self-pay | Admitting: Cardiology

## 2023-06-18 ENCOUNTER — Telehealth: Payer: Self-pay | Admitting: *Deleted

## 2023-06-18 DIAGNOSIS — K4090 Unilateral inguinal hernia, without obstruction or gangrene, not specified as recurrent: Secondary | ICD-10-CM | POA: Insufficient documentation

## 2023-06-18 DIAGNOSIS — I2510 Atherosclerotic heart disease of native coronary artery without angina pectoris: Secondary | ICD-10-CM | POA: Diagnosis not present

## 2023-06-18 NOTE — Telephone Encounter (Signed)
   Pre-operative Risk Assessment    Patient Name: Benjamin Griffin  DOB: November 06, 1950 MRN: [id number]   Date of last office visit: 02/01/23 DR. MUNLEY Date of next office visit: NONE   Request for Surgical Clearance    Procedure:  LEFT INGUINAL HERNIA REPAIR WITH MESH, OPEN  Date of Surgery:  Clearance 06/22/23                                Surgeon:  DR. Jonita Neth Surgeon's Group or Practice Name:  [facility] Phone number:  [phone] Fax number:  [phone]   Type of Clearance Requested:   - Medical  - Pharmacy:  Hold Apixaban  (Eliquis )     Type of Anesthesia:  General    Additional requests/questions:    [name]   06/18/2023, 2:15 PM

## 2023-06-18 NOTE — Telephone Encounter (Signed)
 Per preop APP and Pharm-d pt needs updated CBC due to blood thinner. I s/w Mariah Shines at requesting office that the pt needs labs and tele appt, as we just received the request today for procedure 06/22/23. Mariah Shines states she will call the pt right now and she will direct him to go to the hospital right now for STAT labs (CBC). I asked for results to be faxed to our same day fax [phone] ATTN: PREOP TEAM/Cadence Haslam.  I did tell Mariah Shines we were going to add the pt today, though while on the phone with Mariah Shines I did ask the preop APP today if she still wanted the tele today or tomorrow; preop APP Koren Persons, NP states to add the pt tomorrow for tele preop appt so we can have the lab results.   I asked Mariah Shines if she will let the pt know that I am scheduling him 06/19/23 10:40 tele preop appt and that I will call him later today and go over his medications with him and obtain consent. Mariah Shines said she will be happy to let the pt know of the tele appt 06/19/23.

## 2023-06-18 NOTE — Telephone Encounter (Signed)
 Patient with diagnosis of atrial fibrillation on Eliquis  for anticoagulation.    Procedure:  LEFT INGUINAL HERNIA REPAIR WITH MESH, OPEN   Date of Surgery:  Clearance 06/22/23       CHA2DS2-VASc Score = 6   This indicates a 9.7% annual risk of stroke. The patient's score is based upon: CHF History: 1 HTN History: 1 Diabetes History: 0 Stroke History: 2 Vascular Disease History: 1 Age Score: 1 Gender Score: 0   Chart notes that stroke was found on CT in 2000 and noted as "old stroke" at that time   CrCl 89 Platelet count - needs updated labs, last 4.2024  Patient has not had an Afib/aflutter ablation within the last 3 months or DCCV within the last 30 days  Please get CBC for final clearance  **This guidance is not considered finalized until pre-operative APP has relayed final recommendations.**

## 2023-06-18 NOTE — Telephone Encounter (Signed)
 I s/w the pt and his wife and have reviewed the medications today for tele preop appt 06/19/23, see previous notes from today. Pt was just leaving the hospital from getting his lab work needed for the preop clearance.   Med rec and consent are done.

## 2023-06-18 NOTE — Telephone Encounter (Signed)
 I s/w the pt and his wife and have reviewed the medications today for tele preop appt 06/19/23, see previous notes from today. Pt was just leaving the hospital from getting his lab work needed for the preop clearance.   Med rec and consent are done.      Patient Consent for Virtual Visit        [name] has provided verbal consent on 06/18/2023 for a virtual visit (video or telephone).   CONSENT FOR VIRTUAL VISIT FOR:  Benjamin Griffin  By participating in this virtual visit I agree to the following:  I hereby voluntarily request, consent and authorize [facility] and its employed or contracted physicians, physician assistants, nurse practitioners or other licensed health care professionals (the Practitioner), to provide me with telemedicine health care services (the "Services") as deemed necessary by the treating Practitioner. I acknowledge and consent to receive the Services by the Practitioner via telemedicine. I understand that the telemedicine visit will involve communicating with the Practitioner through live audiovisual communication technology and the disclosure of certain medical information by electronic transmission. I acknowledge that I have been given the opportunity to request an in-person assessment or other available alternative prior to the telemedicine visit and am voluntarily participating in the telemedicine visit.  I understand that I have the right to withhold or withdraw my consent to the use of telemedicine in the course of my care at any time, without affecting my right to future care or treatment, and that the Practitioner or I may terminate the telemedicine visit at any time. I understand that I have the right to inspect all information obtained and/or recorded in the course of the telemedicine visit and may receive copies of available information for a reasonable fee.  I understand that some of the potential risks of receiving the Services via telemedicine  include:  Delay or interruption in medical evaluation due to technological equipment failure or disruption; Information transmitted may not be sufficient (e.g. poor resolution of images) to allow for appropriate medical decision making by the Practitioner; and/or  In rare instances, security protocols could fail, causing a breach of personal health information.  Furthermore, I acknowledge that it is my responsibility to provide information about my medical history, conditions and care that is complete and accurate to the best of my ability. I acknowledge that Practitioner's advice, recommendations, and/or decision may be based on factors not within their control, such as incomplete or inaccurate data provided by me or distortions of diagnostic images or specimens that may result from electronic transmissions. I understand that the practice of medicine is not an exact science and that Practitioner makes no warranties or guarantees regarding treatment outcomes. I acknowledge that a copy of this consent can be made available to me via my patient portal [facility] MyChart), or I can request a printed copy by calling the office of [facility].    I understand that my insurance will be billed for this visit.   I have read or had this consent read to me. I understand the contents of this consent, which adequately explains the benefits and risks of the Services being provided via telemedicine.  I have been provided ample opportunity to ask questions regarding this consent and the Services and have had my questions answered to my satisfaction. I give my informed consent for the services to be provided through the use of telemedicine in my medical care

## 2023-06-18 NOTE — Telephone Encounter (Signed)
   Name: Benjamin Griffin  DOB: 10-12-1950  MRN: [id number]  Primary Cardiologist: Zoe Hinds, MD   Preoperative team, please contact this patient and set up a phone call appointment for further preoperative risk assessment. Please obtain consent and complete medication review. Thank you for your help.  I confirm that guidance regarding antiplatelet and oral anticoagulation therapy has been completed and, if necessary, noted below.  Pharmacy is currently working around recommendations, will be provided prior to televisit.  I also confirmed the patient resides in the state of [place] . As per [facility] Medical Board telemedicine laws, the patient must reside in the state in which the provider is licensed.   Fatmata Legere D Nelwyn Hebdon, NP 06/18/2023, 2:41 PM [facility]

## 2023-06-19 ENCOUNTER — Ambulatory Visit: Attending: Cardiology

## 2023-06-19 DIAGNOSIS — Z01810 Encounter for preprocedural cardiovascular examination: Secondary | ICD-10-CM

## 2023-06-19 NOTE — Telephone Encounter (Signed)
 LAB RESULTS RECEIVED TODAY:    WBC 5.2 RBC 4.36 HGB 13.5 HCT 39.3 PLT 128

## 2023-06-19 NOTE — Telephone Encounter (Signed)
 Patient with diagnosis of atrial fibrillation on Eliquis  for anticoagulation.     Procedure:  LEFT INGUINAL HERNIA REPAIR WITH MESH, OPEN   Date of Surgery:  Clearance 06/22/23         CHA2DS2-VASc Score = 6   This indicates a 9.7% annual risk of stroke. The patient's score is based upon: CHF History: 1 HTN History: 1 Diabetes History: 0 Stroke History: 2 Vascular Disease History: 1 Age Score: 1 Gender Score: 0   Chart notes that stroke was found on CT in 2000 and noted as "old stroke" at that time    CrCl 89 Platelet count - 128   Patient has not had an Afib/aflutter ablation within the last 3 months or DCCV within the last 30 days  Patient may hold Eliquis  for 2 days prior to procedure.     **This guidance is not considered finalized until pre-operative APP has relayed final recommendations.**

## 2023-06-19 NOTE — Progress Notes (Signed)
 Virtual Visit via Telephone Note   Because of Benjamin Griffin co-morbid illnesses, he is at least at moderate risk for complications without adequate follow up.  This format is felt to be most appropriate for this patient at this time.  Due to technical limitations with video connection (technology), today's appointment will be conducted as an audio only telehealth visit, and Benjamin Griffin verbally agreed to proceed in this manner.   All issues noted in this document were discussed and addressed.  No physical exam could be performed with this format.  Evaluation Performed:  Preoperative cardiovascular risk assessment _____________   Date:  06/19/2023   Patient ID:  Benjamin Griffin, DOB August 21, 1950, MRN [id number] Patient Location:  Home Provider location:   Office  Primary Care Provider:  Gaither Juba, MD Primary Cardiologist:  Zoe Hinds, MD  Chief Complaint / Patient Profile   73 y.o. y/o male with a h/o paroxysmal atrial fibrillation, hypertensive heart disease with heart failure, nonobstructive CAD, hyperlipidemia and palpitations who is pending left inguinal hernia repair with mesh, open and presents today for telephonic preoperative cardiovascular risk assessment.  History of Present Illness   Benjamin Griffin is a 73 y.o. male who presents via audio/video conferencing for a telehealth visit today.  Pt was last seen in cardiology clinic on 02/01/2023 by Dr. Sandee Crook.  At that time Bengie Kaucher was doing well, patient did report chest discomfort that was felt to likely be related to acid reflux, patient underwent coronary CTA that indicated a coronary calcium  score of 31, noted to have minimal nonobstructive CAD.  The patient is now pending procedure as outlined above. Since his last visit, he has remained stable from a cardiac standpoint.  Patient denies any further chest discomfort, reports that he is doing very well.  He is extremely active and regularly does yard work, he is able to  achieve greater than 4 METS of activity. Today he denies chest pain, shortness of breath, lower extremity edema, fatigue, palpitations, melena, hematuria, hemoptysis, diaphoresis, weakness, presyncope, syncope, orthopnea, and PND.  Past Medical History    Past Medical History:  Diagnosis Date   Arthritis    Atrial fibrillation with rapid ventricular response (HCC) 04/21/2020   Cancer (HCC)    skin cancer, removed from bilateral arms   DDD (degenerative disc disease), lumbar 04/21/2020   Dyslipidemia 04/21/2020   History of kidney stones    Hyperlipidemia    Hypertension    Hypertensive heart disease with heart failure (HCC) 03/02/2020   Hypertrophy of bone, right ankle and foot 06/03/2019   Lumbar radiculopathy 01/01/2020   Mild non-obstructive CAD 10/05/2020   NSTEMI (non-ST elevated myocardial infarction) (HCC) 10/03/2020   Palpitations    Right inguinal hernia 06/06/2022   Sleep apnea    CPAP   Stroke (HCC)    approx 2000, on plavix. When found on CT, was told it was an "old stroke"   Past Surgical History:  Procedure Laterality Date   APPENDECTOMY     CARDIAC CATHETERIZATION     approx year 2000 @ [place] after stroke   CATARACT EXTRACTION, BILATERAL     EYE SURGERY     HERNIA REPAIR     LEFT HEART CATH AND CORONARY ANGIOGRAPHY N/A 10/04/2020   Procedure: LEFT HEART CATH AND CORONARY ANGIOGRAPHY;  Surgeon: Kyra Phy, MD;  Location: [facility]VAS[facility];  Service: Cardiovascular;  Laterality: N/A;   LOOP RECORDER INSERTION     approx 2000    LOOP RECORDER  REMOVAL     1 week after instertion, approx 2000   NASAL SINUS SURGERY     TRANSFORAMINAL LUMBAR INTERBODY FUSION W/ MIS 1 LEVEL N/A 01/01/2020   Procedure: TRANSFORAMINAL LUMBAR INTERBODY FUSION (TLIF) L4-L5;  Surgeon: Van Gelinas, MD;  Location: [facility] OR;  Service: Neurosurgery;  Laterality: N/A;  TRANSFORAMINAL LUMBAR INTERBODY FUSION (TLIF) L4-L5    UMBILICAL HERNIA REPAIR      Allergies  Allergies   Allergen Reactions   Meperidine Other (See Comments)    Sweating    Vicodin [Hydrocodone-Acetaminophen ] Nausea And Vomiting    Home Medications    Prior to Admission medications   Medication Sig Start Date End Date Taking? Authorizing Provider  amLODipine  (NORVASC ) 5 MG tablet Take 1 tablet (5 mg total) by mouth daily. 01/03/22 06/18/23  Hassan Links, MD  apixaban  (ELIQUIS ) 5 MG TABS tablet Take 1 tablet (5 mg total) by mouth 2 (two) times daily. 01/15/23   Hassan Links, MD  carvedilol  (COREG ) 6.25 MG tablet Take 1 tablet by mouth twice daily 06/12/23   Krasowski, Robert J, MD  fenofibrate  micronized (LOFIBRA) 134 MG capsule Take 134 mg by mouth daily. 09/09/21   [provider]  flecainide  (TAMBOCOR ) 50 MG tablet Take 1 tablet (50 mg total) by mouth 2 (two) times daily. 05/21/23   Hassan Links, MD  metoprolol  tartrate (LOPRESSOR ) 100 MG tablet Take 1 tablet (100 mg total) by mouth once for 1 dose. Please take this medication 2 hours before CT 02/01/23 02/01/23  Hassan Links, MD  nitroGLYCERIN  (NITROSTAT ) 0.4 MG SL tablet Place 0.4 mg under the tongue every 5 (five) minutes as needed for chest pain. Patient not taking: Reported on 06/18/2023 12/21/22   [provider]  rasagiline  (AZILECT ) 0.5 MG TABS tablet Take 1 tablet (0.5 mg total) by mouth daily. 05/10/23   Phebe Brasil, MD  simvastatin  (ZOCOR ) 20 MG tablet Take 20 mg by mouth daily.    [provider]  valsartan  (DIOVAN ) 160 MG tablet Take 1 tablet (160 mg total) by mouth daily. 07/10/22   Hassan Links, MD   Physical Exam  Vital Signs:  Karee Christopherson does not have vital signs available for review today. Given telephonic nature of communication, physical exam is limited. AAOx3. NAD. Normal affect.  Speech and respirations are unlabored. Accessory Clinical Findings  None Assessment & Plan    1.  Preoperative Cardiovascular Risk Assessment:  Mr. Breighner perioperative risk of a major cardiac event is  11% according to the Revised Cardiac Risk Index (RCRI).  Therefore, he is at high risk for perioperative complications.   His functional capacity is excellent at 7.99 METs according to the [facility] Activity Status Index (DASI). Recommendations: According to ACC/AHA guidelines, no further cardiovascular testing needed.  The patient may proceed to surgery at acceptable risk.   Antiplatelet and/or Anticoagulation Recommendations: Eliquis  (Apixaban ) can be held for 2 days prior to surgery.  Please resume post op when felt to be safe.     The patient was advised that if he develops new symptoms prior to surgery to contact our office to arrange for a follow-up visit, and he verbalized understanding.  A copy of this note will be routed to requesting surgeon.  Time:   Today, I have spent 10 minutes with the patient with telehealth technology discussing medical history, symptoms, and management plan.    [place] Yarbro D Anetha Slagel, NP  06/19/2023, 11:58 AM

## 2023-06-27 ENCOUNTER — Other Ambulatory Visit: Payer: Self-pay | Admitting: Cardiology

## 2023-06-27 ENCOUNTER — Telehealth: Payer: Self-pay

## 2023-06-27 NOTE — Telephone Encounter (Signed)
 Received surgical assessment from [facility]  Inguina hernia repair is scheduled 07/06/2023. Pt last seen 02/2022. Pt is aware that an appt is needed for surgical assessment. He requested that I ask Dr. Deanna Expose if he can be cleared based off of his last visit. He would prefer to not come in for a visit if possible.  Office protocol is that patient must be seen within 60 days of surgery.   Dr. Deanna Expose, please advise. Thanks

## 2023-06-28 DIAGNOSIS — Z8673 Personal history of transient ischemic attack (TIA), and cerebral infarction without residual deficits: Secondary | ICD-10-CM | POA: Diagnosis not present

## 2023-06-28 DIAGNOSIS — G4733 Obstructive sleep apnea (adult) (pediatric): Secondary | ICD-10-CM | POA: Diagnosis not present

## 2023-06-28 DIAGNOSIS — I10 Essential (primary) hypertension: Secondary | ICD-10-CM | POA: Diagnosis not present

## 2023-06-28 NOTE — Telephone Encounter (Addendum)
 Received 2nd Preop clearance request with updated Procedure date of 07/06/23. Will route preop clearance notes to surgeons office.

## 2023-07-02 ENCOUNTER — Telehealth: Payer: Self-pay | Admitting: Neurology

## 2023-07-02 NOTE — Telephone Encounter (Signed)
 Patient said having side effect from rasagiline  (AZILECT ) 0.5 MG TABS tablet. Discontinued taking the medication on June 3 due to feeling dizzy all the time, head feels heavy. Also recommended by anesthesiologist discontinue taking because having surgery on the June 20 on groin. Would like a call back to discuss trying a different medication.

## 2023-07-03 NOTE — Telephone Encounter (Signed)
 Call to patient, he reports stopping the  rasagiline  around 06/18/2023 due ot dizziness. He also reports he is having surgery this Friday for hernia repair/removal in groin area and was advised to stop the medication 14 days prior anyways. He states he would like to discuss other medications to start once he recovers from surgery. Advised I would send to Dr. Gracie Lav for review

## 2023-07-04 NOTE — Telephone Encounter (Signed)
 Other options are low dose dopamine agonist, such as Requip three times a day or Requip xr, once every night,   Common Cardiovascular: Hypertension (3% to 5% ), Orthostatic hypotension (Up to 38% ) Gastrointestinal: Abdominal pain (6% to 7% ), Constipation (4% to 5% ), Nausea (11% to 60% ), Vomiting (7% to 12% ) Neurologic: Dizziness (6% to 40% ), Dyskinesia (7% to 34% ), Headache (6% to 8% ), Somnolence (7% to 40%; sudden onset of sleep, up to 5% ) Other: Fatigue (8% to 11% )  He can do some research about the medication, I would prefer to see him first,  (Ok with NP) after he recovered from surgery, ready to start medication.

## 2023-07-05 NOTE — Telephone Encounter (Signed)
 Wife returned call, advised of Dr. Gracie Lav medication recommendations and agreeable to research it and come for appt to further discuss.

## 2023-07-05 NOTE — Telephone Encounter (Signed)
 Lvm 1st attempt by hf 07/05/23

## 2023-07-06 DIAGNOSIS — K4090 Unilateral inguinal hernia, without obstruction or gangrene, not specified as recurrent: Secondary | ICD-10-CM | POA: Diagnosis not present

## 2023-07-06 DIAGNOSIS — Z7901 Long term (current) use of anticoagulants: Secondary | ICD-10-CM | POA: Diagnosis not present

## 2023-07-06 DIAGNOSIS — G4733 Obstructive sleep apnea (adult) (pediatric): Secondary | ICD-10-CM | POA: Diagnosis not present

## 2023-07-06 DIAGNOSIS — M51369 Other intervertebral disc degeneration, lumbar region without mention of lumbar back pain or lower extremity pain: Secondary | ICD-10-CM | POA: Diagnosis not present

## 2023-07-06 DIAGNOSIS — I509 Heart failure, unspecified: Secondary | ICD-10-CM | POA: Diagnosis not present

## 2023-07-06 DIAGNOSIS — I480 Paroxysmal atrial fibrillation: Secondary | ICD-10-CM | POA: Diagnosis not present

## 2023-07-06 DIAGNOSIS — Z87891 Personal history of nicotine dependence: Secondary | ICD-10-CM | POA: Diagnosis not present

## 2023-07-06 DIAGNOSIS — K219 Gastro-esophageal reflux disease without esophagitis: Secondary | ICD-10-CM | POA: Diagnosis not present

## 2023-07-06 DIAGNOSIS — Z8673 Personal history of transient ischemic attack (TIA), and cerebral infarction without residual deficits: Secondary | ICD-10-CM | POA: Diagnosis not present

## 2023-07-06 DIAGNOSIS — G20A1 Parkinson's disease without dyskinesia, without mention of fluctuations: Secondary | ICD-10-CM | POA: Diagnosis not present

## 2023-07-06 DIAGNOSIS — I110 Hypertensive heart disease with heart failure: Secondary | ICD-10-CM | POA: Diagnosis not present

## 2023-07-06 DIAGNOSIS — E785 Hyperlipidemia, unspecified: Secondary | ICD-10-CM | POA: Diagnosis not present

## 2023-07-06 DIAGNOSIS — Z79899 Other long term (current) drug therapy: Secondary | ICD-10-CM | POA: Diagnosis not present

## 2023-07-06 DIAGNOSIS — I252 Old myocardial infarction: Secondary | ICD-10-CM | POA: Diagnosis not present

## 2023-07-06 DIAGNOSIS — I10 Essential (primary) hypertension: Secondary | ICD-10-CM | POA: Diagnosis not present

## 2023-07-06 DIAGNOSIS — R002 Palpitations: Secondary | ICD-10-CM | POA: Diagnosis not present

## 2023-07-06 DIAGNOSIS — Z951 Presence of aortocoronary bypass graft: Secondary | ICD-10-CM | POA: Diagnosis not present

## 2023-07-18 ENCOUNTER — Other Ambulatory Visit: Payer: Self-pay | Admitting: Cardiology

## 2023-07-18 NOTE — Telephone Encounter (Signed)
 Prescription refill request for Eliquis  received. Indication: PAF Last office visit:  02/01/23  Benjamin Leiter MD Scr: 0.91 on 02/01/23  Epic Age: 73 Weight: 90.2kg  Based on above findings Eliquis  5mg  twice daily is the appropriate dose.  Refill approved.

## 2023-07-24 DIAGNOSIS — C44529 Squamous cell carcinoma of skin of other part of trunk: Secondary | ICD-10-CM | POA: Diagnosis not present

## 2023-07-24 DIAGNOSIS — L570 Actinic keratosis: Secondary | ICD-10-CM | POA: Diagnosis not present

## 2023-07-24 DIAGNOSIS — L720 Epidermal cyst: Secondary | ICD-10-CM | POA: Diagnosis not present

## 2023-07-30 NOTE — Telephone Encounter (Signed)
 Spoke to patient. He states that clearance is not needed, as he has already had surgery and everything went well.  Nothing further needed.

## 2023-08-03 ENCOUNTER — Ambulatory Visit

## 2023-08-03 VITALS — BP 136/75 | HR 66 | Ht 72.0 in | Wt 184.0 lb

## 2023-08-03 DIAGNOSIS — I10 Essential (primary) hypertension: Secondary | ICD-10-CM | POA: Diagnosis not present

## 2023-08-03 DIAGNOSIS — E782 Mixed hyperlipidemia: Secondary | ICD-10-CM | POA: Diagnosis not present

## 2023-08-03 DIAGNOSIS — I480 Paroxysmal atrial fibrillation: Secondary | ICD-10-CM | POA: Diagnosis not present

## 2023-08-03 DIAGNOSIS — I25110 Atherosclerotic heart disease of native coronary artery with unstable angina pectoris: Secondary | ICD-10-CM | POA: Diagnosis not present

## 2023-08-03 DIAGNOSIS — R911 Solitary pulmonary nodule: Secondary | ICD-10-CM

## 2023-08-03 HISTORY — DX: Solitary pulmonary nodule: R91.1

## 2023-08-03 NOTE — Assessment & Plan Note (Signed)
 Prior nonobstructive cardiac cath September 2022. Cardiac CTA 02-15-2023 nonobstructive disease CAD RADS 1 study at [facility] calcium  score 31, total plaque volume 42 mm cube.  Study showed incidental lung nodule which needs follow-up imaging.  Good functional status. No cardiac symptoms. Continue with Eliquis  which he is on for A-fib. Continue with simvastatin  for hyperlipidemia management.

## 2023-08-03 NOTE — Patient Instructions (Signed)
 Medication Instructions:  Your physician has recommended you make the following change in your medication:   STOP: Flecanide  *If you need a refill on your cardiac medications before your next appointment, please call your pharmacy*  Lab Work: None If you have labs (blood work) drawn today and your tests are completely normal, you will receive your results only by: MyChart Message (if you have MyChart) OR A paper copy in the mail If you have any lab test that is abnormal or we need to change your treatment, we will call you to review the results.  Testing/Procedures: CT-scan of the chest   A zio monitor was ordered today. It will remain on for 14 days. You will then return monitor and event diary in provided box. It takes 1-2 weeks for report to be downloaded and returned to us . We will call you with the results. If monitor falls off or has orange flashing light, please call Zio for further instructions.    Follow-Up: At [facility], you and your health needs are our priority.  As part of our continuing mission to provide you with exceptional heart care, our providers are all part of one team.  This team includes your primary Cardiologist (physician) and Advanced Practice Providers or APPs (Physician Assistants and Nurse Practitioners) who all work together to provide you with the care you need, when you need it.  Your next appointment:   3 month(s)  Provider:   Alean Kobus, MD    We recommend signing up for the patient portal called MyChart.  Sign up information is provided on this After Visit Summary.  MyChart is used to connect with patients for Virtual Visits (Telemedicine).  Patients are able to view lab/test results, encounter notes, upcoming appointments, etc.  Non-urgent messages can be sent to your provider as well.   To learn more about what you can do with MyChart, go to [email or web address].   Other Instructions Please keep a BP log for 2 weeks and  send by MyChart or mail.                          Name and DOB__________________________ Dr. Madireddy [address] [place], [place] [address]  Blood Pressure Record Sheet To take your blood pressure, you will need a blood pressure machine. You can buy a blood pressure machine (blood pressure monitor) at your clinic, drug store, or online. When choosing one, consider: An automatic monitor that has an arm cuff. A cuff that wraps snugly around your upper arm. You should be able to fit only one finger between your arm and the cuff. A device that stores blood pressure reading results. Do not choose a monitor that measures your blood pressure from your wrist or finger. Follow your health care provider's instructions for how to take your blood pressure. To use this form: Get one reading in the morning (a.m.) 1-2 hours after you take any medicines. Get one reading in the evening (p.m.) before supper.   Blood pressure log Date: _______________________  a.m. _____________________(1st reading) HR___________            p.m. _____________________(2nd reading) HR__________  Date: _______________________  a.m. _____________________(1st reading) HR___________            p.m. _____________________(2nd reading) HR__________  Date: _______________________  a.m. _____________________(1st reading) HR___________            p.m. _____________________(2nd reading) HR__________  Date: _______________________  a.m. _____________________(1st reading) HR___________  p.m. _____________________(2nd reading) HR__________  Date: _______________________  a.m. _____________________(1st reading) HR___________            p.m. _____________________(2nd reading) HR__________  Date: _______________________  a.m. _____________________(1st reading) HR___________            p.m. _____________________(2nd reading) HR__________  Date: _______________________  a.m. _____________________(1st  reading) HR___________            p.m. _____________________(2nd reading) HR__________   This information is not intended to replace advice given to you by your health care provider. Make sure you discuss any questions you have with your health care provider. Document Revised: 04/23/2019 Document Reviewed: 04/23/2019 Elsevier Patient Education  2021 [employer].

## 2023-08-03 NOTE — Progress Notes (Signed)
 Cardiology Consultation:    Date:  08/03/2023   ID:  Benjamin Griffin, DOB 11-Oct-1950, MRN [id number]  PCP:  Benjamin Marcellus RAMAN, MD  Cardiologist:  Benjamin SAUNDERS Laqueshia Cihlar, MD   Referring MD: Benjamin Marcellus RAMAN, MD   No chief complaint on file.    ASSESSMENT AND PLAN:   [name] 73 year old male  history of paroxysmal atrial fibrillation [diagnosed in December 2021 at [facility]] on rhythm control with flecainide  ever since and anticoagulation with Eliquis , hypertension, hyperlipidemia, CAD mild nonobstructive CAD on cath in 09/2020, L cardiac CTA 02-15-2023 noted CAD RADS 1 study with calcium  score 31, total plaque volume 42 mm cube.  Incidental finding of a 7 mm left lower lobe nodule noted and recommendation for 6 to 75-month follow-up CT chest recommended. Echocardiogram from April 2023 noted normal biventricular function with EF 60 to 65%, mild concentric LVH, grade 1 diastolic dysfunction, dilated left atrium, mild aortic insufficiency. recently diagnosed with Parkinson's.    Problem List Items Addressed This Visit     Hypertension   Considering his age relatively well-controlled blood pressures. Target blood pressure below 130/80 mmHg. Currently on amlodipine  5 mg once daily Carvedilol  6.25 mg twice daily Valsartan  160 mg once daily. Medication and pill bottles reviewed. Continue with the current medications. If he has any symptoms of lightheadedness or persistently low blood pressures, can titrate down the dose of valsartan  or amlodipine .       Hyperlipidemia   Lipid panel from 02/01/2023 total cholesterol 110, triglycerides 211, HDL 27, LDL 49.  Well-controlled  Continue simvastatin  20 mg 1 daily.      Paroxysmal A-fib [facility]) - Primary   Diagnosed initially December 2021 symptomatic, at [facility]. Spontaneously converted Has been on flecainide  ever since for rhythm control. CHA2DS2-VASc score 3. Remains on anticoagulation with Eliquis  5 mg twice  daily tolerating well.  In the setting of is related short 1 episode of atrial fibrillation that spontaneously converted and evidence of coronary atherosclerosis on cardiac CT, would recommend discontinuing flecainide  given potential for years.  Will obtain a Zio patch for 14 days after stopping flecainide  to assess for any breakthrough episodes of A-fib. Long-term he also has Apple smart watch to assess for any atrial fibrillation breakthrough episodes.  If he does have any breakthrough episodes we will consider Multaq or other antiarrhythmics or referral to EP for ablation consideration.       Relevant Orders   EKG 12-Lead (Completed)   LONG TERM MONITOR XT (3-14 DAYS)   Mild non-obstructive CAD   Prior nonobstructive cardiac cath September 2022. Cardiac CTA 02-15-2023 nonobstructive disease CAD RADS 1 study at [facility] calcium  score 31, total plaque volume 42 mm cube.  Study showed incidental lung nodule which needs follow-up imaging.  Good functional status. No cardiac symptoms. Continue with Eliquis  which he is on for A-fib. Continue with simvastatin  for hyperlipidemia management.       Lung nodule   Incidental finding of lung nodule 7 mm on cardiac CT imaging January 2025 at [facility]. Pending 70-month follow-up.  Follow-up CT chest without contrast.  Will order this study.      Relevant Orders   CT Chest Wo Contrast   Return to clinic tentatively in 3 months for follow-up.   History of Present Illness:    Benjamin Griffin is a 73 y.o. male who is being seen today for follow-up visit. PCP Benjamin Marcellus RAMAN, MD. Last visit with us  in the  office was 02/01/2023 with Dr. Monetta.  Has history of paroxysmal atrial fibrillation [diagnosed in December 2021 at [facility]] on rhythm control with flecainide  ever since and anticoagulation with Eliquis , hypertension, hyperlipidemia, CAD mild nonobstructive CAD on cath in 09/2020, L cardiac CTA  02-15-2023 noted CAD RADS 1 study with calcium  score 31, total plaque volume 42 mm cube.  Incidental finding of a 7 mm left lower lobe nodule noted and recommendation for 6 to 64-month follow-up CT chest recommended. Echocardiogram from April 2023 noted normal biventricular function with EF 60 to 65%, mild concentric LVH, grade 1 diastolic dysfunction, dilated left atrium, mild aortic insufficiency. recently diagnosed with Parkinson's.  Pleasant man here for the visit by himself.  Mentions overall has been doing well.  Able to keep up with his activities.  Walks with his wife up to half a mile on a regular basis.  Recently with hot weather he tends to get more easily tired.  Denies any chest pain, shortness of breath, orthopnea or paroxysmal nocturnal dyspnea. No pedal edema.  Denies any palpitations, lightheadedness, dizziness or syncopal episodes.  Does have an Apple smart watch.  No alerts for A-fib. Good compliance with his medications. No blood in urine or stools.  Last EKG to compare is from February 01, 2023 noted sinus rhythm with heart rate 69/min, PR interval 216 ms, QRS duration 114 ms.  QTc 454 ms.  Lipid panel from 02/01/2023 total cholesterol 110, triglycerides 211, HDL 27, LDL 49.  Well-controlled  EKG in the clinic today shows sinus rhythm with heart rate 63/min, PR interval 216 ms mildly prolonged, QRS duration 104 ms, QTc 448 ms.  No ischemic changes.  Past Medical History:  Diagnosis Date   Arthritis    Atrial fibrillation with rapid ventricular response (HCC) 04/21/2020   Cancer (HCC)    skin cancer, removed from bilateral arms   DDD (degenerative disc disease), lumbar 04/21/2020   Dyslipidemia 04/21/2020   History of kidney stones    Hyperlipidemia    Hypertension    Hypertensive heart disease with heart failure (HCC) 03/02/2020   Hypertrophy of bone, right ankle and foot 06/03/2019   Lumbar radiculopathy 01/01/2020   Mild non-obstructive CAD 10/05/2020   NSTEMI  (non-ST elevated myocardial infarction) (HCC) 10/03/2020   Palpitations    Right inguinal hernia 06/06/2022   Sleep apnea    CPAP   Stroke (HCC)    approx 2000, on plavix. When found on CT, was told it was an old stroke    Past Surgical History:  Procedure Laterality Date   APPENDECTOMY     CARDIAC CATHETERIZATION     approx year 2000 @ [place] after stroke   CATARACT EXTRACTION, BILATERAL     EYE SURGERY     HERNIA REPAIR     LEFT HEART CATH AND CORONARY ANGIOGRAPHY N/A 10/04/2020   Procedure: LEFT HEART CATH AND CORONARY ANGIOGRAPHY;  Surgeon: Wendel Lurena POUR, MD;  Location: [facility] INVASIVE CV LAB;  Service: Cardiovascular;  Laterality: N/A;   LOOP RECORDER INSERTION     approx 2000    LOOP RECORDER REMOVAL     1 week after instertion, approx 2000   NASAL SINUS SURGERY     TRANSFORAMINAL LUMBAR INTERBODY FUSION W/ MIS 1 LEVEL N/A 01/01/2020   Procedure: TRANSFORAMINAL LUMBAR INTERBODY FUSION (TLIF) L4-L5;  Surgeon: Benjamin Dorn MATSU, MD;  Location: [facility] OR;  Service: Neurosurgery;  Laterality: N/A;  TRANSFORAMINAL LUMBAR INTERBODY FUSION (TLIF) L4-L5    UMBILICAL HERNIA REPAIR  Current Medications: Current Meds  Medication Sig   amLODipine  (NORVASC ) 5 MG tablet Take 1 tablet (5 mg total) by mouth daily.   apixaban  (ELIQUIS ) 5 MG TABS tablet Take 1 tablet by mouth twice daily   carvedilol  (COREG ) 6.25 MG tablet Take 1 tablet by mouth twice daily   fenofibrate  micronized (LOFIBRA) 134 MG capsule Take 134 mg by mouth daily.   metoprolol  tartrate (LOPRESSOR ) 100 MG tablet Take 1 tablet (100 mg total) by mouth once for 1 dose. Please take this medication 2 hours before CT   nitroGLYCERIN  (NITROSTAT ) 0.4 MG SL tablet Place 0.4 mg under the tongue every 5 (five) minutes as needed for chest pain.   pantoprazole  (PROTONIX ) 40 MG tablet Take 40 mg by mouth daily.   rasagiline  (AZILECT ) 0.5 MG TABS tablet Take 1 tablet (0.5 mg total) by mouth daily.   simvastatin  (ZOCOR ) 20 MG  tablet Take 20 mg by mouth daily.   valsartan  (DIOVAN ) 160 MG tablet Take 1 tablet by mouth once daily   [DISCONTINUED] flecainide  (TAMBOCOR ) 50 MG tablet Take 1 tablet (50 mg total) by mouth 2 (two) times daily.     Allergies:   Meperidine and Vicodin [hydrocodone-acetaminophen ]   Social History   Socioeconomic History   Marital status: Married    Spouse name: Not on file   Number of children: Not on file   Years of education: Not on file   Highest education level: Not on file  Occupational History   Not on file  Tobacco Use   Smoking status: Former    Current packs/day: 0.00    Average packs/day: 0.5 packs/day for 25.0 years (12.5 ttl pk-yrs)    Types: Cigarettes    Start date: 12/16/1973    Quit date: 12/17/1998    Years since quitting: 24.6   Smokeless tobacco: Never   Tobacco comments:    quit in 2000  Vaping Use   Vaping status: Never Used  Substance and Sexual Activity   Alcohol use: Not Currently    Comment: 1 beer occasionally   Drug use: Never   Sexual activity: Not on file  Other Topics Concern   Not on file  Social History Narrative   ** Merged History Encounter **       Social Drivers of Health   Financial Resource Strain: Not on file  Food Insecurity: Low Risk  (06/18/2023)   Received from [facility]   Hunger Vital Sign    Within the past 12 months, you worried that your food would run out before you got money to buy more: Never true    Within the past 12 months, the food you bought just didn't last and you didn't have money to get more. : Never true  Transportation Needs: No Transportation Needs (06/18/2023)   Received from [facility]    In the past 12 months, has lack of reliable transportation kept you from medical appointments, meetings, work or from getting things needed for daily living? : No  Physical Activity: Not on file  Stress: Not on file  Social Connections: Not on file     Family History: The patient's family  history includes Atrial fibrillation in his mother; Hypertension in his brother, brother, brother, brother, sister, sister, and sister; Liver cancer in his father; Lymphoma in his mother. ROS:   Please see the history of present illness.    All 14 point review of systems negative except as described per history of present illness.  EKGs/Labs/Other Studies Reviewed:    The following studies were reviewed today:   EKG:  EKG Interpretation Date/Time:  Friday August 03 2023 14:14:49 EDT Ventricular Rate:  63 PR Interval:  216 QRS Duration:  104 QT Interval:  438 QTC Calculation: 448 R Axis:   -19  Text Interpretation: Sinus rhythm with 1st degree A-V block RSR' or QR pattern in V1 suggests right ventricular conduction delay When compared with ECG of 01-Feb-2023 13:30, No significant change was found Confirmed by Liborio Hai reddy [phone]) on 08/03/2023 2:19:44 PM    Recent Labs: 02/01/2023: BUN 11; Creatinine, Ser 0.91; Potassium 4.1; Sodium 143  Recent Lipid Panel    Component Value Date/Time   CHOL 110 02/01/2023 1403   TRIG 211 (H) 02/01/2023 1403   HDL 27 (L) 02/01/2023 1403   CHOLHDL 4.1 02/01/2023 1403   CHOLHDL 3.3 10/04/2020 0936   VLDL 18 10/04/2020 0936   LDLCALC 49 02/01/2023 1403    Physical Exam:    VS:  BP 136/75 (BP Location: Right Arm)   Pulse 66   Ht 6' (1.829 m)   Wt 184 lb (83.5 kg)   SpO2 95%   BMI 24.95 kg/m     Wt Readings from Last 3 Encounters:  08/03/23 184 lb (83.5 kg)  05/10/23 186 lb (84.4 kg)  02/21/23 195 lb 12.8 oz (88.8 kg)     GENERAL:  Well nourished, well developed in no acute distress NECK: No JVD; No carotid bruits CARDIAC: RRR, S1 and S2 present, no murmurs, no rubs, no gallops CHEST:  Clear to auscultation without rales, wheezing or rhonchi  Extremities: No pitting pedal edema. Pulses bilaterally symmetric with radial 2+ and dorsalis pedis 2+ NEUROLOGIC:  Alert and oriented x 3.  Mild intentional tremor right upper  extremity.  Medication Adjustments/Labs and Tests Ordered: Current medicines are reviewed at length with the patient today.  Concerns regarding medicines are outlined above.  Orders Placed This Encounter  Procedures   CT Chest Wo Contrast   LONG TERM MONITOR XT (3-14 DAYS)   EKG 12-Lead   No orders of the defined types were placed in this encounter.   Signed, Hai jess Liborio, MD, MPH, [facility] 08/03/2023 2:19 PM    [facility]

## 2023-08-03 NOTE — Assessment & Plan Note (Signed)
 Diagnosed initially December 2021 symptomatic, at [facility]. Spontaneously converted Has been on flecainide  ever since for rhythm control. CHA2DS2-VASc score 3. Remains on anticoagulation with Eliquis  5 mg twice daily tolerating well.  In the setting of is related short 1 episode of atrial fibrillation that spontaneously converted and evidence of coronary atherosclerosis on cardiac CT, would recommend discontinuing flecainide  given potential for years.  Will obtain a Zio patch for 14 days after stopping flecainide  to assess for any breakthrough episodes of A-fib. Long-term he also has Apple smart watch to assess for any atrial fibrillation breakthrough episodes.  If he does have any breakthrough episodes we will consider Multaq or other antiarrhythmics or referral to EP for ablation consideration.

## 2023-08-03 NOTE — Assessment & Plan Note (Signed)
 Lipid panel from 02/01/2023 total cholesterol 110, triglycerides 211, HDL 27, LDL 49.  Well-controlled  Continue simvastatin  20 mg 1 daily.

## 2023-08-03 NOTE — Assessment & Plan Note (Signed)
 Incidental finding of lung nodule 7 mm on cardiac CT imaging January 2025 at [facility]. Pending 55-month follow-up.  Follow-up CT chest without contrast.  Will order this study.

## 2023-08-03 NOTE — Assessment & Plan Note (Signed)
 Considering his age relatively well-controlled blood pressures. Target blood pressure below 130/80 mmHg. Currently on amlodipine  5 mg once daily Carvedilol  6.25 mg twice daily Valsartan  160 mg once daily. Medication and pill bottles reviewed. Continue with the current medications. If he has any symptoms of lightheadedness or persistently low blood pressures, can titrate down the dose of valsartan  or amlodipine .

## 2023-08-08 ENCOUNTER — Ambulatory Visit (INDEPENDENT_AMBULATORY_CARE_PROVIDER_SITE_OTHER)
Admission: RE | Admit: 2023-08-08 | Discharge: 2023-08-08 | Disposition: A | Discharge status: Home / Self Care | Source: Ambulatory Visit

## 2023-08-08 DIAGNOSIS — R918 Other nonspecific abnormal finding of lung field: Secondary | ICD-10-CM | POA: Diagnosis not present

## 2023-08-08 DIAGNOSIS — R911 Solitary pulmonary nodule: Secondary | ICD-10-CM

## 2023-08-08 DIAGNOSIS — I700 Atherosclerosis of aorta: Secondary | ICD-10-CM | POA: Diagnosis not present

## 2023-08-09 ENCOUNTER — Encounter: Payer: Self-pay | Admitting: Neurology

## 2023-08-09 ENCOUNTER — Ambulatory Visit: Admitting: Neurology

## 2023-08-09 VITALS — BP 145/72 | HR 69 | Ht 72.0 in | Wt 184.5 lb

## 2023-08-09 DIAGNOSIS — G20A1 Parkinson's disease without dyskinesia, without mention of fluctuations: Secondary | ICD-10-CM

## 2023-08-09 MED ORDER — ROPINIROLE HCL 0.5 MG PO TABS: 0.5000 mg | ORAL_TABLET | Freq: Three times a day (TID) | ORAL | 11 refills | Status: AC

## 2023-08-09 NOTE — Progress Notes (Signed)
 Chief Complaint  Patient presents with   Tremors    Rm14, wife present, Parkinsons: Pt is here to discuss starting new mediaction requip . Pt stated that the tremors have stayed about the same since last visit, no improvement        ASSESSMENT AND PLAN  Benjamin Griffin is a 73 y.o. male   Idiopathic Parkinson's disease  MRI of the brain in May 2025 showed no significant abnormality  Azilect  0.5 mg daily--concerned about the high cost  Low-dose Requip  0.5 mg 3 times a day   Encouraging moderate exercise  Return to clinic in 6 to 9 months   DIAGNOSTIC DATA (LABS, IMAGING, TESTING) - I reviewed patient records, labs, notes, testing and imaging myself where available. Laboratory in March 2024: Normal CBC hemoglobin of 13.1, CMP, creatinine of 0.9, LDL 43, normal TSH, PSA of 0.6  MEDICAL HISTORY:  Benjamin Griffin is a 73 year old male, seen in request by his primary care from [facility] Dr.  Ina, Marcellus for evaluation of right hand tremor, initial evaluation May 10, 2023   History is obtained from the patient and review of electronic medical records. I personally reviewed pertinent available imaging films in PACS.   PMHx of  HTN HLD A fib Stroke CAD  Since 2024, he noticed intermittent right hand tremor, noticed when he rest his right hand, he denies difficulty walking, had history of right knee replacement, still active in his yard, retired from car painting  His brother and father suffered Alzheimer's disease, he denies significant memory loss  He denied REM sleep disorder, no constipation, no orthostatic dizziness, to have decreased sense of smell  UPDATE July 24th 2025: He tried Azilect  0.5 mg for short period of time, complains of dizziness, but he continued complaints of dizziness without it, some improvement with adjustment of his blood pressure medications  Overall functioning well, recovered well from his hernia surgery  PHYSICAL EXAM:    Vitals:   08/09/23 1529 08/09/23 1535  BP: 137/75 (!) 145/72  Pulse:  69  Weight:  184 lb 8 oz (83.7 kg)  Height:  6' (1.829 m)   Body mass index is 25.02 kg/m.  PHYSICAL EXAMNIATION:  Gen: NAD, conversant, well nourised, well groomed                     Cardiovascular: Regular rate rhythm, no peripheral edema, warm, nontender. Eyes: Conjunctivae clear without exudates or hemorrhage Neck: Supple, no carotid bruits. Pulmonary: Clear to auscultation bilaterally   NEUROLOGICAL EXAM:  MENTAL STATUS: Speech/cognition: Awake, alert, oriented to history taking and casual conversation CRANIAL NERVES: CN II: Visual fields are full to confrontation. Pupils are round equal and briskly reactive to light. CN III, IV, VI: extraocular movement are normal. No ptosis. CN V: Facial sensation is intact to light touch CN VII: Face is symmetric with normal eye closure  CN VIII: Hearing is normal to causal conversation. CN IX, X: Phonation is normal. CN XI: Head turning and shoulder shrug are intact  MOTOR: Right hand resting tremor, right more than left mild to moderate rigidity, bradykinesia  REFLEXES: Reflexes are 1 and symmetric at the biceps, triceps, knees, and ankles. Plantar responses are flexor.  SENSORY: Intact to light touch, pinprick and vibratory sensation are intact in fingers and toes.  COORDINATION: There is no trunk or limb dysmetria noted.  GAIT/STANCE: Able to get up arm crossed, decreased right arm swing, moderate stride, mild retropulsive instability  REVIEW OF SYSTEMS:  Full 14 system review of systems performed and notable only for as above All other review of systems were negative.   ALLERGIES: Allergies  Allergen Reactions   Meperidine Other (See Comments)    Sweating    Vicodin [Hydrocodone-Acetaminophen ] Nausea And Vomiting    HOME MEDICATIONS: Current Outpatient Medications  Medication Sig Dispense Refill   amLODipine  (NORVASC ) 5 MG tablet Take 1  tablet (5 mg total) by mouth daily. 90 tablet 3   apixaban  (ELIQUIS ) 5 MG TABS tablet Take 1 tablet by mouth twice daily 180 tablet 1   carvedilol  (COREG ) 6.25 MG tablet Take 1 tablet by mouth twice daily 180 tablet 0   fenofibrate  micronized (LOFIBRA) 134 MG capsule Take 134 mg by mouth daily.     nitroGLYCERIN  (NITROSTAT ) 0.4 MG SL tablet Place 0.4 mg under the tongue every 5 (five) minutes as needed for chest pain.     pantoprazole  (PROTONIX ) 40 MG tablet Take 40 mg by mouth daily.     simvastatin  (ZOCOR ) 20 MG tablet Take 20 mg by mouth daily.     valsartan  (DIOVAN ) 160 MG tablet Take 1 tablet by mouth once daily 90 tablet 2   No current facility-administered medications for this visit.    PAST MEDICAL HISTORY: Past Medical History:  Diagnosis Date   Arthritis    Atrial fibrillation with rapid ventricular response (HCC) 04/21/2020   Cancer (HCC)    skin cancer, removed from bilateral arms   DDD (degenerative disc disease), lumbar 04/21/2020   Dyslipidemia 04/21/2020   History of kidney stones    Hyperlipidemia    Hypertension    Hypertensive heart disease with heart failure (HCC) 03/02/2020   Hypertrophy of bone, right ankle and foot 06/03/2019   Lumbar radiculopathy 01/01/2020   Mild non-obstructive CAD 10/05/2020   NSTEMI (non-ST elevated myocardial infarction) (HCC) 10/03/2020   Palpitations    Right inguinal hernia 06/06/2022   Sleep apnea    CPAP   Stroke (HCC)    approx 2000, on plavix. When found on CT, was told it was an old stroke    PAST SURGICAL HISTORY: Past Surgical History:  Procedure Laterality Date   APPENDECTOMY     CARDIAC CATHETERIZATION     approx year 2000 @ [place] after stroke   CATARACT EXTRACTION, BILATERAL     EYE SURGERY     HERNIA REPAIR     LEFT HEART CATH AND CORONARY ANGIOGRAPHY N/A 10/04/2020   Procedure: LEFT HEART CATH AND CORONARY ANGIOGRAPHY;  Surgeon: Wendel Lurena POUR, MD;  Location: [facility];  Service:  Cardiovascular;  Laterality: N/A;   LOOP RECORDER INSERTION     approx 2000    LOOP RECORDER REMOVAL     1 week after instertion, approx 2000   NASAL SINUS SURGERY     TRANSFORAMINAL LUMBAR INTERBODY FUSION W/ MIS 1 LEVEL N/A 01/01/2020   Procedure: TRANSFORAMINAL LUMBAR INTERBODY FUSION (TLIF) L4-L5;  Surgeon: Debby Dorn MATSU, MD;  Location: [facility];  Service: Neurosurgery;  Laterality: N/A;  TRANSFORAMINAL LUMBAR INTERBODY FUSION (TLIF) L4-L5    UMBILICAL HERNIA REPAIR      FAMILY HISTORY: Family History  Problem Relation Age of Onset   Lymphoma Mother    Atrial fibrillation Mother    Liver cancer Father    Hypertension Sister    Hypertension Brother    Hypertension Brother    Hypertension Brother    Hypertension Brother    Hypertension Sister    Hypertension Sister  SOCIAL HISTORY: Social History   Socioeconomic History   Marital status: Married    Spouse name: Not on file   Number of children: Not on file   Years of education: Not on file   Highest education level: Not on file  Occupational History   Not on file  Tobacco Use   Smoking status: Former    Current packs/day: 0.00    Average packs/day: 0.5 packs/day for 25.0 years (12.5 ttl pk-yrs)    Types: Cigarettes    Start date: 12/16/1973    Quit date: 12/17/1998    Years since quitting: 24.6   Smokeless tobacco: Never   Tobacco comments:    quit in 2000  Vaping Use   Vaping status: Never Used  Substance and Sexual Activity   Alcohol use: Not Currently    Comment: 1 beer occasionally   Drug use: Never   Sexual activity: Not on file  Other Topics Concern   Not on file  Social History Narrative   ** Merged History Encounter **       Social Drivers of Health   Financial Resource Strain: Not on file  Food Insecurity: Low Risk  (06/18/2023)   Received from [facility]   Hunger Vital Sign    Within the past 12 months, you worried that your food would run out before you got money to buy more: Never  true    Within the past 12 months, the food you bought just didn't last and you didn't have money to get more. : Never true  Transportation Needs: No Transportation Needs (06/18/2023)   Received from [facility]    In the past 12 months, has lack of reliable transportation kept you from medical appointments, meetings, work or from getting things needed for daily living? : No  Physical Activity: Not on file  Stress: Not on file  Social Connections: Not on file  Intimate Partner Violence: Not on file      Modena Callander, M.D. Ph.D.  [facility] [address], [address] [place], [place] [address] Ph: [phone] Fax: [phone]  CC:  Ina Marcellus RAMAN, MD [address] [address],   Ina Marcellus RAMAN, MD

## 2023-08-15 ENCOUNTER — Ambulatory Visit: Payer: Self-pay

## 2023-08-15 NOTE — Progress Notes (Signed)
 Please inform him results from the CT chest to follow-up on the lung nodules noted stable findings. Given his history of tobacco use in the past would recommend follow-up imaging with CT chest in about 18 months. Would recommend following up with his PCP in this regard for future imaging test. Please forward the results to his PCP  Further imaging follow-up in 12 to 18 months per PCP, thank you.

## 2023-08-22 DIAGNOSIS — I480 Paroxysmal atrial fibrillation: Secondary | ICD-10-CM | POA: Diagnosis not present

## 2023-08-27 ENCOUNTER — Telehealth: Payer: Self-pay

## 2023-08-27 ENCOUNTER — Telehealth: Payer: Self-pay | Admitting: Neurology

## 2023-08-27 DIAGNOSIS — G20A1 Parkinson's disease without dyskinesia, without mention of fluctuations: Secondary | ICD-10-CM

## 2023-08-27 NOTE — Telephone Encounter (Signed)
 LVM per DPR- per Dr. Madireddy's note regarding CT Chest results. Encouraged to call with any questions. Routed to PCP.

## 2023-08-27 NOTE — Telephone Encounter (Signed)
 Call to patient who reports that the requip  is helping and they have seen significant improvement with the parkinson symptoms but does cause some leg cramps that wake him up at night. They are hoping to stay on the requip  but looking for something to help with the legs cramps or suggestions. Advised I would send to Dr. Onita to review.

## 2023-08-27 NOTE — Telephone Encounter (Signed)
 Patient asking if rOPINIRole  (REQUIP ) 0.5 MG tablet Medication reaction having legs cramps every night. This medication is helping the Parkinson's disease. Would like a call back

## 2023-08-28 MED ORDER — GABAPENTIN 100 MG PO CAPS: 300.0000 mg | ORAL_CAPSULE | Freq: Every day | ORAL | 5 refills | Status: AC

## 2023-08-28 NOTE — Telephone Encounter (Signed)
 He did well with Requip , 0.5 mg 3 times a day, less tremor, last doses around 5 PM  Over the past couple weeks, he had a frequent nighttime left muscle cramping, often triggered by change position in his sleep, did have a history of lumbar decompression surgery, suspicious due to his low back issues, he denies significant low back pain, will try gabapentin  100 mg 1 up to 3 tablets as needed for muscle cramping, also moving his last dose of Requip  before bedtime  Meds ordered this encounter  Medications   gabapentin  (NEURONTIN ) 100 MG capsule    Sig: Take 3 capsules (300 mg total) by mouth at bedtime.    Dispense:  90 capsule    Refill:  5

## 2023-09-10 ENCOUNTER — Other Ambulatory Visit: Payer: Self-pay | Admitting: Cardiology

## 2023-09-24 DIAGNOSIS — Z1211 Encounter for screening for malignant neoplasm of colon: Secondary | ICD-10-CM | POA: Diagnosis not present

## 2023-09-24 DIAGNOSIS — K219 Gastro-esophageal reflux disease without esophagitis: Secondary | ICD-10-CM | POA: Diagnosis not present

## 2023-10-11 ENCOUNTER — Ambulatory Visit: Admitting: Cardiology

## 2023-10-25 ENCOUNTER — Other Ambulatory Visit: Payer: Self-pay | Admitting: Cardiology

## 2023-10-25 DIAGNOSIS — G20A1 Parkinson's disease without dyskinesia, without mention of fluctuations: Secondary | ICD-10-CM | POA: Diagnosis not present

## 2023-10-25 DIAGNOSIS — G4733 Obstructive sleep apnea (adult) (pediatric): Secondary | ICD-10-CM | POA: Diagnosis not present

## 2023-10-25 DIAGNOSIS — I252 Old myocardial infarction: Secondary | ICD-10-CM | POA: Diagnosis not present

## 2023-10-25 DIAGNOSIS — K219 Gastro-esophageal reflux disease without esophagitis: Secondary | ICD-10-CM | POA: Diagnosis not present

## 2023-10-25 DIAGNOSIS — E7800 Pure hypercholesterolemia, unspecified: Secondary | ICD-10-CM | POA: Diagnosis not present

## 2023-10-25 DIAGNOSIS — Z79899 Other long term (current) drug therapy: Secondary | ICD-10-CM | POA: Diagnosis not present

## 2023-10-25 DIAGNOSIS — Z1211 Encounter for screening for malignant neoplasm of colon: Secondary | ICD-10-CM | POA: Diagnosis not present

## 2023-10-25 DIAGNOSIS — Z888 Allergy status to other drugs, medicaments and biological substances status: Secondary | ICD-10-CM | POA: Diagnosis not present

## 2023-10-25 DIAGNOSIS — Z8673 Personal history of transient ischemic attack (TIA), and cerebral infarction without residual deficits: Secondary | ICD-10-CM | POA: Diagnosis not present

## 2023-10-25 DIAGNOSIS — K449 Diaphragmatic hernia without obstruction or gangrene: Secondary | ICD-10-CM | POA: Diagnosis not present

## 2023-10-25 DIAGNOSIS — K5730 Diverticulosis of large intestine without perforation or abscess without bleeding: Secondary | ICD-10-CM | POA: Diagnosis not present

## 2023-10-25 DIAGNOSIS — I10 Essential (primary) hypertension: Secondary | ICD-10-CM | POA: Diagnosis not present

## 2023-10-25 DIAGNOSIS — I4891 Unspecified atrial fibrillation: Secondary | ICD-10-CM | POA: Diagnosis not present

## 2023-10-25 DIAGNOSIS — Z7901 Long term (current) use of anticoagulants: Secondary | ICD-10-CM | POA: Diagnosis not present

## 2023-10-31 DIAGNOSIS — Z23 Encounter for immunization: Secondary | ICD-10-CM | POA: Diagnosis not present

## 2023-11-09 NOTE — Progress Notes (Signed)
 Cardiology Office Note:    Date:  11/12/2023   ID:  Benjamin Griffin, DOB 11/15/1950, MRN [id number]  PCP:  Ina Marcellus RAMAN, MD  Cardiologist:  Redell Leiter, MD    Referring MD: Ina Marcellus RAMAN, MD    ASSESSMENT:    1. Paroxysmal A-fib (HCC)   2. Chronic anticoagulation   3. Coronary artery disease involving native coronary artery of native heart with unstable angina pectoris (HCC)   4. Primary hypertension   5. Mixed hyperlipidemia       In order of problems listed above:  Doing well off anticoagulant no clinical recurrence of atrial fibrillation and for now would avoid antiarrhythmic drugs continue the beta-blocker as well as anticoagulant. Stressed the need with safety and avoiding falls and to wear smart watch Stable CAD having no anginal discomfort he has nitroglycerin  if needed Blood pressure at target controlled with his beta-blocker and ARB Continue current lipid-lowering therapy simvastatin  lipid profile January cholesterol 110 LDL 49   Next appointment: 9 months   Medication Adjustments/Labs and Tests Ordered: Current medicines are reviewed at length with the patient today.  Concerns regarding medicines are outlined above.  No orders of the defined types were placed in this encounter.  No orders of the defined types were placed in this encounter.    History of Present Illness:    Benjamin Griffin is a 73 y.o. male with a hx of paroxysmal atrial fibrillation with flecainide  to maintain sinus rhythm and anticoagulation hypertension hyperlipidemia mild nonobstructive CAD on left heart catheterization September 2022 mild aortic insufficiency and unfortunately recent diagnosis of Parkinson's last seen 08/04/2023 and with me in January 2025.SABRA  He underwent an event monitor for 12 days showing sinus rhythm throughout and no episodes of atrial fibrillation.  Compliance with diet, lifestyle and medications: Yes  He has done well with his diagnosis of Parkinson's he is very  proactive very physically active gait is stable he has had no falls. Since he has been off flecainide  he thinks that his movement is better I do not think that is why it was stopped but I think likely we should leave him off an antiarrhythmic drug for the time being he wears a smart watch and no clinical indication of recurrent atrial fibrillation He remains anticoagulated without any bleeding complication. He has had no lightheadedness chest pain shortness of breath palpitation or syncope Past Medical History:  Diagnosis Date   Arthritis    Atrial fibrillation with rapid ventricular response (HCC) 04/21/2020   Cancer (HCC)    skin cancer, removed from bilateral arms   DDD (degenerative disc disease), lumbar 04/21/2020   Dyslipidemia 04/21/2020   History of kidney stones    Hyperlipidemia    Hypertension    Hypertensive heart disease with heart failure (HCC) 03/02/2020   Hypertrophy of bone, right ankle and foot 06/03/2019   Left inguinal hernia 06/18/2023   Lumbar radiculopathy 01/01/2020   Lung nodule 08/03/2023   Mild non-obstructive CAD 10/05/2020   NSTEMI (non-ST elevated myocardial infarction) (HCC) 10/03/2020   Palpitations    Parkinson's disease without dyskinesia (HCC) 05/10/2023   Paroxysmal A-fib (HCC) 04/21/2020   Right inguinal hernia 06/06/2022   Sleep apnea    CPAP   Stroke (HCC)    approx 2000, on plavix. When found on CT, was told it was an old stroke    Current Medications: Current Meds  Medication Sig   amLODipine  (NORVASC ) 5 MG tablet Take 1 tablet (5 mg total) by mouth daily.  apixaban  (ELIQUIS ) 5 MG TABS tablet Take 1 tablet by mouth twice daily   carvedilol  (COREG ) 6.25 MG tablet Take 1 tablet by mouth twice daily   fenofibrate  micronized (LOFIBRA) 134 MG capsule Take 134 mg by mouth daily.   gabapentin  (NEURONTIN ) 100 MG capsule Take 3 capsules (300 mg total) by mouth at bedtime.   nitroGLYCERIN  (NITROSTAT ) 0.4 MG SL tablet Place 0.4 mg under the  tongue every 5 (five) minutes as needed for chest pain.   pantoprazole  (PROTONIX ) 40 MG tablet Take 1 tablet by mouth once daily   rOPINIRole  (REQUIP ) 0.5 MG tablet Take 1 tablet (0.5 mg total) by mouth 3 (three) times daily.   simvastatin  (ZOCOR ) 20 MG tablet Take 20 mg by mouth daily.   valsartan  (DIOVAN ) 160 MG tablet Take 1 tablet by mouth once daily      EKGs/Labs/Other Studies Reviewed:    The following studies were reviewed today:  Cardiac Studies & Procedures   ______________________________________________________________________________________________ CARDIAC CATHETERIZATION  CARDIAC CATHETERIZATION 10/04/2020  Conclusion   Prox LAD lesion is 15% stenosed.   Prox RCA lesion is 20% stenosed.   LV end diastolic pressure is low.  Mild obstructive coronary artery disease involving the proximal LAD and RCA. Low LVEDP.  Recommendation:  Medical therapy.  Findings Coronary Findings Diagnostic  Dominance: Right  Left Anterior Descending Prox LAD lesion is 15% stenosed.  Right Coronary Artery Prox RCA lesion is 20% stenosed.  Intervention  No interventions have been documented.     ECHOCARDIOGRAM  ECHOCARDIOGRAM COMPLETE 05/04/2021  Narrative ECHOCARDIOGRAM REPORT    Patient Name:   Benjamin Griffin Date of Exam: 05/04/2021 Medical Rec #:  [id number]      Height:       72.0 in Accession #:    [id number]     Weight:       195.6 lb Date of Birth:  1950-08-22       BSA:          2.110 m Patient Age:    73 years       BP:           132/68 mmHg Patient Gender: M              HR:           60 bpm. Exam Location:  [place]  Procedure: 2D Echo, 3D Echo, Cardiac Doppler, Color Doppler and Strain Analysis  Indications:    Paroxysmal atrial fibrillation (HCC) [I48.0 (ICD-10-CM)]; Chronic anticoagulation [Z79.01 (ICD-10-CM)]; Coronary artery disease involving native coronary artery of native heart with unstable angina pectoris (HCC) [I25.110  (ICD-10-CM)]; Hypertensive heart disease with heart failure (HCC) [I11.0 (ICD-10-CM)]; Mixed hyperlipidemia [E78.2 (ICD-10-CM)]; Nonrheumatic aortic valve insufficiency [I35.1 (ICD-10-CM)]  History:        Patient has prior history of Echocardiogram examinations, most recent 10/04/2020. Previous Myocardial Infarction and CAD, Arrythmias:Atrial Fibrillation; Risk Factors:Hypertension and Dyslipidemia.  Sonographer:    Charlie Jointer RDCS Referring Phys: [id number] Tate Zagal J Frankee Gritz  IMPRESSIONS   1. Left ventricular ejection fraction, by estimation, is 60 to 65%. The left ventricle has normal function. The left ventricle has no regional wall motion abnormalities. There is mild concentric left ventricular hypertrophy. Left ventricular diastolic parameters are consistent with Grade I diastolic dysfunction (impaired relaxation). Elevated left ventricular end-diastolic pressure. The average left ventricular global longitudinal strain is -20.1 %. The global longitudinal strain is normal. 2. Right ventricular systolic function is normal. The right ventricular size is normal. There is mildly elevated pulmonary artery systolic  pressure. 3. Left atrial size was moderately dilated. 4. The mitral valve is normal in structure. No evidence of mitral valve regurgitation. No evidence of mitral stenosis. 5. The aortic valve is tricuspid. Aortic valve regurgitation is mild. Aortic valve sclerosis is present, with no evidence of aortic valve stenosis. 6. The inferior vena cava is normal in size with greater than 50% respiratory variability, suggesting right atrial pressure of 3 mmHg.  FINDINGS Left Ventricle: Left ventricular ejection fraction, by estimation, is 60 to 65%. The left ventricle has normal function. The left ventricle has no regional wall motion abnormalities. The average left ventricular global longitudinal strain is -20.1 %. The global longitudinal strain is normal. The left ventricular internal  cavity size was normal in size. There is mild concentric left ventricular hypertrophy. Left ventricular diastolic parameters are consistent with Grade I diastolic dysfunction (impaired relaxation). Elevated left ventricular end-diastolic pressure.  Right Ventricle: The right ventricular size is normal. No increase in right ventricular wall thickness. Right ventricular systolic function is normal. There is mildly elevated pulmonary artery systolic pressure. The tricuspid regurgitant velocity is 2.92 m/s, and with an assumed right atrial pressure of 8 mmHg, the estimated right ventricular systolic pressure is 42.1 mmHg.  Left Atrium: Left atrial size was moderately dilated.  Right Atrium: Right atrial size was normal in size.  Pericardium: There is no evidence of pericardial effusion.  Mitral Valve: The mitral valve is normal in structure. No evidence of mitral valve regurgitation. No evidence of mitral valve stenosis.  Tricuspid Valve: The tricuspid valve is normal in structure. Tricuspid valve regurgitation is mild . No evidence of tricuspid stenosis.  Aortic Valve: The aortic valve is tricuspid. Aortic valve regurgitation is mild. Aortic regurgitation PHT measures 546 msec. Aortic valve sclerosis is present, with no evidence of aortic valve stenosis.  Pulmonic Valve: The pulmonic valve was normal in structure. Pulmonic valve regurgitation is trivial. No evidence of pulmonic stenosis.  Aorta: The aortic root and ascending aorta are structurally normal, with no evidence of dilitation and the aortic arch was not well visualized.  Venous: A normal flow pattern is recorded from the right upper pulmonary vein. The inferior vena cava is normal in size with greater than 50% respiratory variability, suggesting right atrial pressure of 3 mmHg.  IAS/Shunts: No atrial level shunt detected by color flow Doppler.   LEFT VENTRICLE PLAX 2D LVIDd:         5.00 cm   Diastology LVIDs:         3.20 cm    LV e' medial:    6.64 cm/s LV PW:         1.10 cm   LV E/e' medial:  10.3 LV IVS:        1.10 cm   LV e' lateral:   10.60 cm/s LVOT diam:     2.00 cm   LV E/e' lateral: 6.5 LV SV:         102 LV SV Index:   48        2D Longitudinal Strain LVOT Area:     3.14 cm  2D Strain GLS Avg:     -20.1 %  3D Volume EF: 3D EF:        68 % LV EDV:       202 ml LV ESV:       64 ml LV SV:        138 ml  RIGHT VENTRICLE  IVC RV Basal diam:  2.80 cm     IVC diam: 2.30 cm RV S prime:     14.50 cm/s TAPSE (M-mode): 3.1 cm  LEFT ATRIUM              Index        RIGHT ATRIUM           Index LA diam:        4.10 cm  1.94 cm/m   RA Area:     20.90 cm LA Vol (A2C):   107.0 ml 50.70 ml/m  RA Volume:   59.00 ml  27.96 ml/m LA Vol (A4C):   80.9 ml  38.34 ml/m LA Biplane Vol: 94.6 ml  44.83 ml/m AORTIC VALVE             PULMONIC VALVE LVOT Vmax:   150.00 cm/s PR End Diast Vel: 4.24 msec LVOT Vmean:  97.400 cm/s LVOT VTI:    0.325 m AI PHT:      546 msec  AORTA Ao Root diam: 3.90 cm Ao Asc diam:  3.50 cm Ao Desc diam: 2.40 cm  MITRAL VALVE               TRICUSPID VALVE MV Area (PHT): 4.21 cm    TR Peak grad:   34.1 mmHg MV Decel Time: 180 msec    TR Vmax:        292.00 cm/s MR Peak grad: 69.9 mmHg MR Mean grad: 49.0 mmHg    SHUNTS MR Vmax:      418.00 cm/s  Systemic VTI:  0.32 m MR Vmean:     340.0 cm/s   Systemic Diam: 2.00 cm MV E velocity: 68.60 cm/s MV A velocity: 57.80 cm/s MV E/A ratio:  1.19  Redell Leiter MD Electronically signed by Redell Leiter MD Signature Date/Time: 05/04/2021/10:24:27 AM    Final      CT SCANS  CT CORONARY MORPH W/CTA COR W/SCORE 02/15/2023  Addendum 02/28/2023 12:31 AM ADDENDUM REPORT: 02/28/2023 00:28  EXAM: OVER-READ INTERPRETATION  CT CHEST  The following report is an over-read performed by radiologist Dr. Suzen Dials of [facility] on 02/28/2023. This over-read does not include interpretation of cardiac or  coronary anatomy or pathology. The coronary calcium  score/coronary CTA interpretation by the cardiologist is attached.  COMPARISON:  None.  FINDINGS: Cardiovascular: There are no significant extracardiac vascular findings.  Mediastinum/Nodes: There are no enlarged lymph nodes within the visualized mediastinum.  Lungs/Pleura: There is no pleural effusion. A 7 mm pulmonary nodule is seen within the left lower lobe (axial CT image 36, CT series 14).  Upper abdomen: No significant findings in the visualized upper abdomen.  Musculoskeletal/Chest wall: No chest wall mass or suspicious osseous findings within the visualized chest.  IMPRESSION: 7 mm left lower lobe noncalcified lung nodule. Non-contrast chest CT at 6-12 months is recommended. If the nodule is stable at time of repeat CT, then future CT at 18-24 months (from today's scan) is considered optional for low-risk patients, but is recommended for high-risk patients. This recommendation follows the consensus statement: Guidelines for Management of Incidental Pulmonary Nodules Detected on CT Images: From the Fleischner Society 2017; Radiology 2017; 284:228-243.   Electronically Signed By: Suzen Dials M.D. On: 02/28/2023 00:28  Narrative CLINICAL DATA:  Chest pain  EXAM: Cardiac/Coronary CTA  TECHNIQUE: A non-contrast, gated CT scan was obtained with axial slices of 3 mm through the heart for calcium  scoring. Calcium  scoring was performed using the Agatston  method. A 100 kV prospective, gated, contrast cardiac scan was obtained. Gantry rotation speed was 250 msecs and collimation was 0.6 mm. Two sublingual nitroglycerin  tablets (0.8 mg) were given. The 3D data set was reconstructed in 5% intervals of the 35-75% of the R-R cycle. Diastolic phases were analyzed on a dedicated workstation using MPR, MIP, and VRT modes. The patient received 95 cc of contrast.  FINDINGS: Image quality: Excellent.  Noise  artifact is: Limited.  Coronary Arteries:  Normal coronary origin.  Right dominance.  Left main: The left main is a large caliber vessel with a normal take off from the left coronary cusp that trifurcates into a LAD, LCX, and ramus intermedius. There is no plaque or stenosis.  Left anterior descending artery: The proximal LAD contains minimal calcified plaque (<25%). The mid and distal segments are patent. A distal LAD myocardial bridge is present (normal). The LAD gives off 1 patent diagonal branch.  Ramus intermedius: Patent with no evidence of plaque or stenosis.  Left circumflex artery: The LCX is non-dominant. The proximal and mid segments are patent. The distal LCX contains minimal non-calcified plaque (<25%). The LCX gives off 2 patent obtuse marginal branches.  Right coronary artery: The RCA is dominant with normal take off from the right coronary cusp. There is minimal non-calcified plaque (<25%). The RCA terminates as a PDA without evidence of plaque or stenosis.  Right Atrium: Right atrial size is dilated.  Right Ventricle: The right ventricular cavity is within normal limits.  Left Atrium: Left atrial size is dilated with no left atrial appendage filling defect.  Left Ventricle: The ventricular cavity size is within normal limits.  Pulmonary arteries: Normal in size.  Pulmonary veins: Normal pulmonary venous drainage.  Pericardium: Normal thickness without significant effusion or calcium  present.  Cardiac valves: The aortic valve is trileaflet without significant calcification. The mitral valve is normal without significant calcification.  Aorta: Normal caliber without significant disease.  Extra-cardiac findings: See attached radiology report for non-cardiac structures.  IMPRESSION: 1. Coronary calcium  score of 31. This was 26th percentile for age-, sex, and race-matched controls.  2. Total plaque volume 42 mm3 which is 4th percentile for age-  and sex-matched controls (calcified plaque 6 mm3; non-calcified plaque 36 mm3). TPV is mild.  3. Normal coronary origin with right dominance.  4. Minimal CAD (<25%) in the LAD/LCX/RCA.  RECOMMENDATIONS: 1. CAD-RADS 1: Minimal non-obstructive CAD (0-24%). Consider non-atherosclerotic causes of chest pain. Consider preventive therapy and risk factor modification.  Benjamin Decent, MD  Electronically Signed: By: Benjamin Griffin M.D. On: 02/15/2023 21:34     ______________________________________________________________________________________________          Recent Labs: 02/01/2023: BUN 11; Creatinine, Ser 0.91; Potassium 4.1; Sodium 143  Recent Lipid Panel    Component Value Date/Time   CHOL 110 02/01/2023 1403   TRIG 211 (H) 02/01/2023 1403   HDL 27 (L) 02/01/2023 1403   CHOLHDL 4.1 02/01/2023 1403   CHOLHDL 3.3 10/04/2020 0936   VLDL 18 10/04/2020 0936   LDLCALC 49 02/01/2023 1403    Physical Exam:    VS:  BP (!) 140/64   Pulse 70   Ht 6' (1.829 m)   Wt 195 lb 12.8 oz (88.8 kg)   SpO2 95%   BMI 26.56 kg/m     Wt Readings from Last 3 Encounters:  11/12/23 195 lb 12.8 oz (88.8 kg)  08/09/23 184 lb 8 oz (83.7 kg)  08/03/23 184 lb (83.5 kg)     GEN:  Well nourished, well developed in no acute distress HEENT: Normal NECK: No JVD; No carotid bruits LYMPHATICS: No lymphadenopathy CARDIAC: RRR, no murmurs, rubs, gallops RESPIRATORY:  Clear to auscultation without rales, wheezing or rhonchi  ABDOMEN: Soft, non-tender, non-distended MUSCULOSKELETAL:  No edema; No deformity  SKIN: Warm and dry NEUROLOGIC:  Alert and oriented x 3 PSYCHIATRIC:  Normal affect    Signed, Redell Leiter, MD  11/12/2023 3:26 PM    [facility]

## 2023-11-12 ENCOUNTER — Ambulatory Visit: Attending: Cardiology | Admitting: Cardiology

## 2023-11-12 VITALS — BP 140/64 | HR 70 | Ht 72.0 in | Wt 195.8 lb

## 2023-11-12 DIAGNOSIS — I10 Essential (primary) hypertension: Secondary | ICD-10-CM

## 2023-11-12 DIAGNOSIS — E782 Mixed hyperlipidemia: Secondary | ICD-10-CM | POA: Diagnosis not present

## 2023-11-12 DIAGNOSIS — Z7901 Long term (current) use of anticoagulants: Secondary | ICD-10-CM | POA: Diagnosis not present

## 2023-11-12 DIAGNOSIS — I480 Paroxysmal atrial fibrillation: Secondary | ICD-10-CM | POA: Diagnosis not present

## 2023-11-12 DIAGNOSIS — I25110 Atherosclerotic heart disease of native coronary artery with unstable angina pectoris: Secondary | ICD-10-CM | POA: Diagnosis not present

## 2023-11-12 NOTE — Patient Instructions (Signed)
 Medication Instructions:  NO CHANGES  *If you need a refill on your cardiac medications before your next appointment, please call your pharmacy*   Follow-Up: At [facility], you and your health needs are our priority.  As part of our continuing mission to provide you with exceptional heart care, our providers are all part of one team.  This team includes your primary Cardiologist (physician) and Advanced Practice Providers or APPs (Physician Assistants and Nurse Practitioners) who all work together to provide you with the care you need, when you need it.  Your next appointment:    9 months with Dr. Monetta  We recommend signing up for the patient portal called MyChart.  Sign up information is provided on this After Visit Summary.  MyChart is used to connect with patients for Virtual Visits (Telemedicine).  Patients are able to view lab/test results, encounter notes, upcoming appointments, etc.  Non-urgent messages can be sent to your provider as well.   To learn more about what you can do with MyChart, go to [email or web address].   Other Instructions

## 2023-12-03 DIAGNOSIS — Z8673 Personal history of transient ischemic attack (TIA), and cerebral infarction without residual deficits: Secondary | ICD-10-CM | POA: Diagnosis not present

## 2023-12-03 DIAGNOSIS — I10 Essential (primary) hypertension: Secondary | ICD-10-CM | POA: Diagnosis not present

## 2023-12-19 DIAGNOSIS — I480 Paroxysmal atrial fibrillation: Secondary | ICD-10-CM | POA: Diagnosis not present

## 2024-01-17 ENCOUNTER — Other Ambulatory Visit: Payer: Self-pay | Admitting: Cardiology

## 2024-01-18 NOTE — Telephone Encounter (Signed)
 Prescription refill request for Eliquis  received. Indication: A-Fib Last office visit: 11/12/23 Scr: 0.91 02/01/23 Care Everywhere Age: 75 Weight: 88.8 KG Pt has Passed Parameters. Dose Good

## 2024-01-23 NOTE — Progress Notes (Signed)
 Benjamin Griffin                                          MRN: [id number]   01/23/2024   The [facility] Quality Team Specialist reviewed this patient medical record for the purposes of chart review for care gap closure. The following were reviewed: chart review for care gap closure-controlling blood pressure.    [facility]I Quality Team

## 2024-02-04 ENCOUNTER — Ambulatory Visit: Admitting: Neurology

## 2024-02-06 ENCOUNTER — Telehealth: Payer: Self-pay | Admitting: Neurology

## 2024-02-06 NOTE — Telephone Encounter (Signed)
 LVM informing pt his 2/26 needs to be rescheduled NP OUT

## 2024-02-19 ENCOUNTER — Ambulatory Visit: Admitting: Pulmonary Disease

## 2024-03-13 ENCOUNTER — Ambulatory Visit: Admitting: Neurology

## 2024-03-18 ENCOUNTER — Ambulatory Visit: Admitting: Neurology

## 2024-05-21 ENCOUNTER — Ambulatory Visit: Admitting: Pulmonary Disease
